# Patient Record
Sex: Female | Born: 1985 | Hispanic: No | Marital: Married | Smoking: Never smoker
Health system: Southern US, Community
[De-identification: ages and names within clinical notes are randomized; demographics above are authoritative.]

## PROBLEM LIST (undated history)

## (undated) DIAGNOSIS — O139 Gestational [pregnancy-induced] hypertension without significant proteinuria, unspecified trimester: Secondary | ICD-10-CM

## (undated) HISTORY — DX: Gestational (pregnancy-induced) hypertension without significant proteinuria, unspecified trimester: O13.9

---

## 2017-09-13 DIAGNOSIS — D4861 Neoplasm of uncertain behavior of right breast: Secondary | ICD-10-CM | POA: Insufficient documentation

## 2017-11-28 DIAGNOSIS — D4860 Neoplasm of uncertain behavior of unspecified breast: Secondary | ICD-10-CM

## 2017-11-28 HISTORY — DX: Neoplasm of uncertain behavior of unspecified breast: D48.60

## 2018-09-01 HISTORY — PX: MASTECTOMY, PARTIAL: SHX709

## 2019-05-23 ENCOUNTER — Encounter: Payer: Self-pay | Admitting: Advanced Practice Midwife

## 2019-05-23 ENCOUNTER — Ambulatory Visit (INDEPENDENT_AMBULATORY_CARE_PROVIDER_SITE_OTHER): Payer: PRIVATE HEALTH INSURANCE | Admitting: Advanced Practice Midwife

## 2019-05-23 ENCOUNTER — Other Ambulatory Visit: Payer: Self-pay

## 2019-05-23 DIAGNOSIS — Z3493 Encounter for supervision of normal pregnancy, unspecified, third trimester: Secondary | ICD-10-CM | POA: Insufficient documentation

## 2019-05-23 DIAGNOSIS — Z3A11 11 weeks gestation of pregnancy: Secondary | ICD-10-CM

## 2019-05-23 DIAGNOSIS — Z3480 Encounter for supervision of other normal pregnancy, unspecified trimester: Secondary | ICD-10-CM | POA: Insufficient documentation

## 2019-05-23 DIAGNOSIS — Z3481 Encounter for supervision of other normal pregnancy, first trimester: Secondary | ICD-10-CM

## 2019-05-23 NOTE — Progress Notes (Signed)
NOB C/o some nausea, and breast tenderness  4 + home test

## 2019-05-24 NOTE — Progress Notes (Signed)
05/23/2019   Virtual Visit via Telephone Note  I connected with Guy Sandifer on 05/23/19 at  8:50 AM EDT by telephone and verified that I am speaking with the correct person using two identifiers.   I discussed the limitations, risks, security and privacy concerns of performing an evaluation and management service by telephone and the availability of in person appointments. I also discussed with the patient that there may be a patient responsible charge related to this service. The patient expressed understanding and agreed to proceed.  The patient was located at home I spoke with the patient from my  Office phone The names of people involved in this encounter were: Lucyle Montemayorand myself Rod Can, CNM.   Chief Complaint: Missed period  Transfer of Care Patient: no  History of Present Illness: Ms. Losano is a 33 y.o. G2P1001 [redacted]w[redacted]d based on Patient's last menstrual period was 03/04/2019 (within days). with an Estimated Date of Delivery: 12/09/19, with the above CC.   Her periods were: regular periods every 28 days She was using no method when she conceived. Planned pregnancy  She has Positive signs or symptoms of nausea/vomiting of pregnancy. She has Negative signs or symptoms of miscarriage or preterm labor She was not taking different medications around the time she conceived/early pregnancy. Since her LMP, she has not used alcohol Since her LMP, she has not used tobacco products Since her LMP, she has not used illegal drugs.   Current or past history of domestic violence. no  Infection History:  1. Since her LMP, she has not had a viral illness.  2. She admits to close contact with children on a regular basis   3. She has a history of chicken pox.  4. Patient or partner has history of genital herpes  no 5. History of STI (GC, CT, HPV, syphilis, HIV)  no  6.  She does not live with someone with TB or TB exposed. 7. History of recent travel :  no 8. She  identifies Negative Zika risk factors for her and her partner 40. There are not cats in the home in the home.  She understands that while pregnant she should not change cat litter.   Genetic Screening Questions: (Includes patient, baby's father, or anyone in either family)   1. Patient's age >/= 28 at [facility]  no 2. Thalassemia ([place], [place], [place], or Asian background): MCV<80  no 3. Neural tube defect (meningomyelocele, spina bifida, anencephaly)  no 4. Congenital heart defect  no  5. Down syndrome  no 6. Tay-Sachs (Jewish, [place])  no 7. Canavan's Disease  no 8. Sickle cell disease or trait (African)  no  9. Hemophilia or other blood disorders  no  10. Muscular dystrophy  no  11. Cystic fibrosis  no  12. Huntington's Chorea  no  13. Mental retardation/autism  no 14. Other inherited genetic or chromosomal disorder  no 15. Maternal metabolic disorder (DM, PKU, etc)  no 16. Patient or FOB with a child with a birth defect not listed above no  16a. Patient or FOB with a birth defect themselves no 17. Recurrent pregnancy loss, or stillbirth  no  18. Any medications since LMP other than prenatal vitamins (include vitamins, supplements, OTC meds, drugs, alcohol)  no 19. Any other genetic/environmental exposure to discuss: by her home scale she weighs 158 today and thinks she was about 152 pounds prior to the pregnancy. According to her chart, she weighed 135 pounds at a clinic visit 1 year ago.  She admits to having gained 20 pounds in the past year.   ROS:  Review of Systems  Constitutional: Negative.   HENT: Negative.   Eyes: Negative.   Respiratory: Negative.   Cardiovascular: Negative.   Gastrointestinal: Negative.   Genitourinary: Negative.   Musculoskeletal: Negative.   Skin: Negative.   Neurological: Negative.   Endo/Heme/Allergies: Negative.   Psychiatric/Behavioral:       Anxiety    OBGYN History: As per HPI. OB History  Gravida Para Term Preterm AB  Living  2 1 1     1   SAB TAB Ectopic Multiple Live Births          1    # Outcome Date GA Lbr Len/2nd Weight Sex Delivery Anes PTL Lv  2 Current           1 Term 07/29/11 [redacted]w[redacted]d  7 lb 1 oz (3.204 kg) F Vag-Spont EPI  LIV    Any issues with any prior pregnancies: G1 SVD 2012 7#, BP elevated at end of pregnancy  Any prior children are healthy, doing well, without any problems or issues: yes Last pap smear 04/03/18 at [facility] ASCUS/HRHPV 2016, LGSIL 2017, LSIL/CIN1 with positive HPV (HR (non 16/18) not detected)  2019, notes say colposcopy for treatment but no record of colposcopy done History of STIs: No   Past Medical History: Past Medical History:  Diagnosis Date  . Gestational hypertension   . 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 MASTECTOMY, PARTIAL Right 09/01/2018    Family History:  Family History  Problem Relation Age of Onset  . Hypertension Mother   . Hypertension Father    She denies any female cancers, bleeding or blood clotting disorders.   Social History:  Social History   Socioeconomic History  . Marital status: Significant Other    Spouse name: Not on file  . Number of children: Not on file  . Years of education: Not on file  . Highest education level: Not on file  Occupational History  . Not on file  Social Needs  . Financial resource strain: Not on file  . Food insecurity    Worry: Not on file    Inability: Not on file  . Transportation needs    Medical: Not on file    Non-medical: Not on file  Tobacco Use  . Smoking status: Never Smoker  . Smokeless tobacco: Never Used  Substance and Sexual Activity  . Alcohol use: Not Currently    Frequency: Never  . Drug use: Never  . Sexual activity: Yes    Birth control/protection: None  Lifestyle  . Physical activity    Days per week: Not on file    Minutes per session: Not on file  .  Stress: Not on file  Relationships  . Social Herbalist on phone: Not on file    Gets together: Not on file    Attends religious service: Not on file    Active member of club or organization: Not on file    Attends meetings of clubs or organizations: Not on file    Relationship status: Not on file  . Intimate partner violence    Fear of current or ex partner: Not on file    Emotionally abused: Not on file    Physically abused: Not on file    Forced sexual activity: Not on file  Other Topics Concern  . Not on file  Social History Narrative  . Not on file    Allergy: No Known Allergies  Current Outpatient Medications: No current outpatient medications on file.   Physical Exam: Physical Exam could not be performed. Because of the COVID-19 outbreak this visit was performed over the phone and not in person.    Assessment: Ms. Boyack is a 33 y.o. G2P1001 [redacted]w[redacted]d based on Patient's last menstrual period was 03/04/2019 (within days). with an Estimated Date of Delivery: 12/09/19,  for prenatal care.  Plan:  1) Avoid alcoholic beverages. 2) Patient encouraged not to smoke.  3) Discontinue the use of all non-medicinal drugs and chemicals.  4) Take prenatal vitamins daily.  5) Seatbelt use advised 6) Nutrition, food safety (fish, cheese advisories, and high nitrite foods) and exercise discussed. 7) Hospital and practice style delivering at [facility] discussed  8) Patient is asked about travel to areas at risk for the [place] virus, and counseled to avoid travel and exposure to mosquitoes or sexual partners who may have themselves been exposed to the virus. Testing is discussed, and will be ordered as appropriate.  9) [facility]birth classes at [facility] advised 10) Genetic Screening, such as with 1st Trimester Screening, cell free fetal DNA, AFP testing, and Ultrasound, as well as with amniocentesis and CVS as appropriate, is discussed with patient. She plans to have cell free DNA genetic  testing this pregnancy. 11) In person visit in 1 week for dating and all prenatal labs including PAP smear, and MaterniT 21 if dates agree   Problem list reviewed and updated.  I discussed the assessment and treatment plan with the patient. The patient was provided an opportunity to ask questions and all were answered. The patient agreed with the plan and demonstrated an understanding of the instructions.   The patient was advised to call back or seek an in-person evaluation if the symptoms worsen or if the condition fails to improve as anticipated.  I provided 25 minutes of non-face-to-face time during this encounter.   Rod Can, [facility] 05/24/2019, 1:07 PM

## 2019-06-03 ENCOUNTER — Other Ambulatory Visit: Payer: Self-pay

## 2019-06-03 ENCOUNTER — Ambulatory Visit (INDEPENDENT_AMBULATORY_CARE_PROVIDER_SITE_OTHER): Payer: PRIVATE HEALTH INSURANCE

## 2019-06-03 ENCOUNTER — Other Ambulatory Visit (HOSPITAL_COMMUNITY)
Admission: RE | Admit: 2019-06-03 | Discharge: 2019-06-03 | Disposition: A | Discharge status: Home / Self Care | Payer: PRIVATE HEALTH INSURANCE | Source: Ambulatory Visit | Attending: Obstetrics and Gynecology | Admitting: Obstetrics and Gynecology

## 2019-06-03 ENCOUNTER — Ambulatory Visit (INDEPENDENT_AMBULATORY_CARE_PROVIDER_SITE_OTHER): Payer: PRIVATE HEALTH INSURANCE | Admitting: Obstetrics and Gynecology

## 2019-06-03 ENCOUNTER — Encounter: Payer: Self-pay | Admitting: Obstetrics and Gynecology

## 2019-06-03 ENCOUNTER — Other Ambulatory Visit: Payer: Self-pay | Admitting: Advanced Practice Midwife

## 2019-06-03 VITALS — BP 90/60 | Wt 157.6 lb

## 2019-06-03 DIAGNOSIS — Z113 Encounter for screening for infections with a predominantly sexual mode of transmission: Secondary | ICD-10-CM | POA: Diagnosis present

## 2019-06-03 DIAGNOSIS — Z3480 Encounter for supervision of other normal pregnancy, unspecified trimester: Secondary | ICD-10-CM | POA: Insufficient documentation

## 2019-06-03 DIAGNOSIS — Z3A13 13 weeks gestation of pregnancy: Secondary | ICD-10-CM

## 2019-06-03 DIAGNOSIS — Z3A01 Less than 8 weeks gestation of pregnancy: Secondary | ICD-10-CM | POA: Diagnosis not present

## 2019-06-03 DIAGNOSIS — Z3481 Encounter for supervision of other normal pregnancy, first trimester: Secondary | ICD-10-CM

## 2019-06-03 DIAGNOSIS — Z124 Encounter for screening for malignant neoplasm of cervix: Secondary | ICD-10-CM | POA: Diagnosis present

## 2019-06-03 DIAGNOSIS — Z1379 Encounter for other screening for genetic and chromosomal anomalies: Secondary | ICD-10-CM

## 2019-06-03 DIAGNOSIS — O3481 Maternal care for other abnormalities of pelvic organs, first trimester: Secondary | ICD-10-CM

## 2019-06-03 DIAGNOSIS — N8311 Corpus luteum cyst of right ovary: Secondary | ICD-10-CM | POA: Diagnosis not present

## 2019-06-03 LAB — POCT URINALYSIS DIPSTICK OB
Glucose, UA: NEGATIVE
POC,PROTEIN,UA: NEGATIVE

## 2019-06-03 NOTE — Progress Notes (Signed)
ROB/Dating/NOB labs- no concerns

## 2019-06-03 NOTE — Progress Notes (Signed)
    Routine Prenatal Care Visit  Subjective  Heather Horn is a 33 y.o. G2P1001 at [redacted]w[redacted]d being seen today for ongoing prenatal care.  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  ----------------------------------------------------------------------------------- Patient reports no complaints.   Contractions: Not present. Vag. Bleeding: None.  Movement: Absent. Denies leaking of fluid.  ----------------------------------------------------------------------------------- The following portions of the patient's history were reviewed and updated as appropriate: allergies, current medications, past family history, past medical history, past social history, past surgical history and problem list. Problem list updated.   Objective  Blood pressure 90/60, weight 157 lb 9.6 oz (71.5 kg), last menstrual period 03/04/2019. Pregravid weight 152 lb (68.9 kg) Total Weight Gain 5 lb 9.6 oz (2.54 kg) Urinalysis:      Fetal Status: Fetal Heart Rate (bpm): 155   Movement: Absent     General:  Alert, oriented and cooperative. Patient is in no acute distress.  Skin: Skin is warm and dry. No rash noted.   Cardiovascular: Normal heart rate noted  Respiratory: Normal respiratory effort, no problems with respiration noted  Abdomen: Soft, gravid, appropriate for gestational age. Pain/Pressure: Absent     Pelvic:  Cervical exam performed        Extremities: Normal range of motion.     Mental Status: Normal mood and affect. Normal behavior. Normal judgment and thought content.     Assessment   33 y.o. G2P1001 at [redacted]w[redacted]d by  01/15/2020, by Ultrasound presenting for routine prenatal visit  Plan   Pregnancy #2 Problems (from 03/04/19 to present)    Problem Noted Resolved   Supervision of other normal pregnancy, antepartum 05/23/2019 by  Rod Can, CNM No   Overview Signed 05/23/2019  9:38 AM by Rod Can, [place] Prenatal Labs  Dating  Blood type:     Genetic Screen 1 Screen:    AFP:     Quad:     NIPS: Antibody:   Anatomic [place]  Rubella:   Varicella: @VZVIGG @  GTT Early:               Third trimester:  RPR:     Rhogam  HBsAg:     TDaP vaccine                       Flu Shot: HIV:     Baby Food                                GBS:   Contraception  Pap: hx LSIL 2019 neg HR HPV  CBB     CS/VBAC NA Partial R mastectomy-borderline phyllodes tumor 2018  Support Person Lennette Bihari              Gestational age appropriate obstetric precautions including but not limited to vaginal bleeding, contractions, leaking of fluid and fetal movement were reviewed in detail with the patient.    Pap collected NOB labs Desire Maternit21 at next visit  Return in about 4 weeks (around 07/01/2019) for ROB.  Homero Fellers MD [facility], [facility] 06/03/2019, 9:39 AM

## 2019-06-03 NOTE — Patient Instructions (Signed)
Hello,  Given the current COVID-19 pandemic, our practice is making changes in how we are providing care to our patients. We are limiting in-person visits for the safety of all of our patients.   As a practice, we have met to discuss the best way to minimize visits, but still provide excellent care to our expecting mothers.  We have decided on the following visit structure for low-risk pregnancies.  Initial Pregnancy visit will be conducted as a telephone or web visit.  Between 10-14 weeks  there will be one in-person visit for an ultrasound, lab work, and genetic screening. 20 weeks in-person visit with an anatomy ultrasound  28 weeks in-person office visit for a 1-hour glucose test and a TDAP vaccination 32 weeks in-person office visit 34 weeks telephone visit 36 weeks in-person office visit for GBS, chlamydia, and gonorrhea testing 38 weeks in-person office visit 40 weeks in-person office visit  Understandably, some patients will require more visits than what is outlined above. Additional visits will be determined on a case-by-case basis.   We will, as always, be available for emergencies or to address concerns that might arise between in-person visits. We ask that you allow [place] the opportunity to address any concerns over the phone or through a virtual visit first. We will be available to return your phone calls throughout the day.   If you are able to purchase a scale, a blood pressure machine, and a home fetal doppler visits could be limited further. This will help decrease your exposure risks, but these purchases are not a necessity.   Things seem to change daily and there is the possibility that this structure could change, please be patient as we adapt to a new way of caring for patients.   Thank you for trusting [place] with your prenatal care. Our practice values you and looks forward to providing you with excellent care.   Sincerely,   [facility], [place]     COVID-19 and Your Pregnancy FAQ  How can I prevent infection with COVID-19 during my pregnancy? Social distancing is key. Please limit any interactions in public. Try and work from home if possible. Frequently wash your hands after touching possibly contaminated surfaces. Avoid touching your face.  Minimize trips to the store. Consider online ordering when possible.   Should I wear a mask? YES. It is recommended by the CDC that all people wear a cloth mask or facial covering in public. You should wear a mask to your visits in the office. This will help reduce transmission as well as your risk or acquiring COVID-19. New studies are showing that even asymptomatic individuals can spread the virus from talking.   Where can I get a mask? [place] and the city of [place] are partnering to provide masks to community members. You can pick up a mask from several locations. This website also has instructions about how to make a mask by sewing or without sewing by using a t-shirt or bandana.  https://www.[place].gov/i-want-to/learn-about/covid-19-information-and-updates/covid-19-face-mask-project  Studies have shown that if you were a tube or nylon stocking from pantyhose over a cloth mask it makes the cloth mask almost as effective as a N95 mask.  https://www.davis-walter.com/  What are the symptoms of COVID-19? Fever (greater than 100.4 F), dry cough, shortness of breath.  Am I more at risk for COVID-19 since I am pregnant? There is not currently data showing that pregnant women are more adversely impacted by COVID-19 than the general population. However, we  know that pregnant women tend to have worse respiratory complications from similar diseases such as the flu and SARS and for this reason should be considered an at-risk population.  What do I do if I am experiencing  the symptoms of COVID-19? Testing is being limited because of test availability. If you are experiencing symptoms you should quarantine yourself, and the members of your family, for at least 2 weeks at home.   Please visit this website for more information: RunningShows.co.za.html  When should I go to the Emergency Room? Please go to the emergency room if you are experiencing ANY of these symptoms*:  1.    Difficulty breathing or shortness of breath 2.    Persistent pain or pressure in the chest 3.    Confusion or difficulty being aroused (or awakened) 4.    Bluish lips or face  *This list is not all inclusive. Please consult our office for any other symptoms that are severe or concerning.  What do I do if I am having difficulty breathing? You should go to the Emergency Room for evaluation. At this time they have a tent set up for evaluating patients with COVID-19 symptoms.   How will my prenatal care be different because of the COVID-19 pandemic? It has been recommended to reduce the frequency of face-to-face visits and use resources such as telephone and virtual visits when possible. Using a scale, blood pressure machine and fetal doppler at home can further help reduce face-to-face visits. You will be provided with additional information on this topic.  We ask that you come to your visits alone to minimize potential exposures to  COVID-19.  How can I receive childbirth education? At this time in-person classes have been cancelled. You can register for online childbirth education, breastfeeding, and newborn care classes.  Please visit:  [email or web address] for more information  How will my hospital birth experience be different? The hospital is currently limiting visitors. This means that while you are in labor you can only have one person at the hospital with you. Additional family members will not be allowed to wait in the  building or outside your room. Your one support person can be the father of the baby, a relative, a doula, or a friend. Once one support person is designated that person will wear a band. This band cannot be shared with multiple people.  Nitrous Gas is not being offered for pain relief since the tubing and filter for the machine can not be sanitized in a way to guarantee prevention of transmission of COVID-19.  Nasal cannula use of oxygen for fetal indications has also been discontinued.  Currently a clear plastic sheet is being hung between mom and the delivering provider during pushing and delivery to help prevent transmission of COVID-19.      How long will I stay in the hospital for after giving birth? It is also recommended that discharge home be expedited during the COVID-19 outbreak. This means staying for 1 day after a vaginal delivery and 2 days after a cesarean section. Patients who need to stay longer for medical reasons are allowed to do so, but the goal will be for expedited discharge home.   What if I have COVID-19 and I am in labor? We ask that you wear a mask while on labor and delivery. We will try and accommodate you being placed in a room that is capable of filtering the air. Please call ahead if you are in labor and on your way  to the hospital. The phone number for labor and delivery at [facility] is [phone].  If I have COVID-19 when my baby is born how can I prevent my baby from contracting COVID-19? This is an issue that will have to be discussed on a case-by-case basis. Current recommendations suggest providing separate isolation rooms for both the mother and new infant as well as limiting visitors. However, there are practical challenges to this recommendation. The situation will assuredly change and decisions will be influenced by the desires of the mother and availability of space.  Some suggestions are the use of a curtain or physical barrier  between mom and infant, hand hygiene, mom wearing a mask, or 6 feet of spacing between a mom and infant.   Can I breastfeed during the COVID-19 pandemic?   Yes, breastfeeding is encouraged.  Can I breastfeed if I have COVID-19? Yes. Covid-19 has not been found in breast milk. This means you cannot give COVID-19 to your child through breast milk. Breast feeding will also help pass antibodies to fight infection to your baby.   What precautions should I take when breastfeeding if I have COVID-19? If a mother and newborn do room-in and the mother wishes to feed at the breast, she should put on a facemask and practice hand hygiene before each feeding.  What precautions should I take when pumping if I have COVID-19? Prior to expressing breast milk, mothers should practice hand hygiene. After each pumping session, all parts that come into contact with breast milk should be thoroughly washed and the entire pump should be appropriately disinfected per the manufacturer's instructions. This expressed breast milk should be fed to the newborn by a healthy caregiver.  What if I am pregnant and work in healthcare? Based on limited data regarding COVID-19 and pregnancy, ACOG currently does not propose creating additional restrictions on pregnant health care personnel because of COVID-19 alone. Pregnant women do not appear to be at higher risk of severe disease related to COVID-19. Pregnant health care personnel should follow CDC risk assessment and infection control guidelines for health care personnel exposed to patients with suspected or confirmed COVID-19. Adherence to recommended infection prevention and control practices is an important part of protecting all health care personnel in health care settings.    Information on COVID-19 in pregnancy is very limited; however, facilities may want to consider limiting exposure of pregnant health care personnel to patients with confirmed or suspected COVID-19  infection, especially during higher-risk procedures (eg, aerosol-generating procedures), if feasible, based on staffing availability.      Pregnancy and COVID-19 Coronavirus disease, also called COVID-19, is an infection of the lungs and airways (respiratory tract). It is unclear at this time if pregnancy makes it more likely for you to get COVID-19, or what effects the infection may have on your unborn baby. However, pregnancy causes changes to your heart, lungs, and the body's disease-fighting system (immune system). Some of these changes make it more likely for you to get sick and have more serious illness. Therefore, it is important for you to take precautions in order to protect yourself and your unborn baby. Work with your health care team to develop a plan to protect yourself from all infections, including COVID-19. This is one way for you to stay healthy during your pregnancy and to keep your baby healthy as well. How does this affect me? If you get COVID-19, there is a risk that you may:  Get a respiratory illness  that can lead to pneumonia.  Give birth to your baby before 37 weeks of pregnancy (premature birth). If you have or may have COVID-19, your health care provider may recommend special precautions around your pregnancy. This may affect how you:  Receive care before delivery (prenatal care). How you visit your health care provider may change. Tests and scans may need to be performed differently.  Receive care during labor and delivery. This may affect your birth plan, including who may be with you during labor and delivery.  Receive care after you deliver your baby (postpartum care). You may stay longer in the hospital, and in a special room. Your baby may also need to stay away from you.  Feed your baby after he or she is born. Pregnancy can be an especially stressful time because of the changes in your body and the preparation involved in becoming a parent. In addition, you  may be feeling especially fearful, anxious, or stressed because of COVID-19 and how it is affecting you. How does this affect my baby? It is not known whether a mother will transmit the virus to her unborn baby. There is a risk that if you get COVID-19:  The virus that causes COVID-19 can pass to your baby.  You may have premature birth. Your baby may require more medical care if this happens. What can I do to lower my risk?  There is no vaccine to help prevent COVID-19. However, there are actions that you can take to protect yourself and others from this virus. Cleaning and personal hygiene  Wash your hands often with soap and water for at least 20 seconds. If soap and water are not available, use alcohol-based hand sanitizer.  Avoid touching your mouth, face, eyes, or nose.  Clean and disinfect objects and surfaces that are frequently touched every day. This may include: ? Counters and tables. ? Doorknobs and light switches. ? Sinks and faucets. ? Electronics such as phones, remote controls, keyboards, computers, and tablets. Stay away from others  Stay away from people who are sick, if possible.  Avoid social gatherings and travel.  Stay home as much as possible. Follow these instructions: Breastfeeding It is not known if the virus that causes COVID-19 can pass through breast milk to your baby. You should make a plan for feeding your infant with your family and your health care team. If you have or may have COVID-19, your health care provider may recommend that you take precautions while breastfeeding, such as:  Washing your hands before feeding your baby.  Wearing a mask while feeding your baby.  Pumping or expressing breast milk to feed to your baby. If possible, ask someone in your household who is not sick to feed your baby the expressed breast milk. ? Wash your hands before touching pump parts. ? Wash and disinfect all pump parts after expressing milk. Follow the  manufacturer's instructions to clean and disinfect all pump parts. General instructions  If you think you have a COVID-19 infection, contact your health care provider right away. Tell your health care provider that you think you may have a COVID-19 infection.  Follow your health care provider's instructions on taking medicines. Some medicines may be unsafe to take during pregnancy.  Cover your mouth and nose by wearing a mask or other cloth covering over your face when you go out in public.  Find ways to manage stress. These may include: ? Using relaxation techniques like meditation and deep breathing. ? Getting regular  exercise. Most women can continue their usual exercise routine during pregnancy. Ask your health care provider what activities are safe for you. ? Seeking support from family, friends, or spiritual resources. If you cannot be together in person, you can still connect by phone calls, texts, video calls, or online messaging. ? Spending time doing relaxing activities that you enjoy, like listening to music or reading a good book.  Ask for help if you have counseling or nutritional needs during pregnancy. Your health care provider can offer advice or refer you to resources or specialists who can help you with various needs.  Keep all follow-up visits as told by your health care provider. This is important. Where to find more information Centers for Disease Control and Prevention (CDC): http://gutierrez-robinson.com/ World Health Organization [facility]): https://www.morales.com/ [employer] of [employer]st[employer]ians and Gynecologists (ACOG): StickerEmporium.tn Questions to ask your health care team  What should I do if I have COVID-19 symptoms?  How will COVID-19 affect my prenatal care visits, tests and scans, labor and delivery, and postpartum care?   Should I plan to breastfeed my baby?  Where can I find mental health resources?  Where can I find support if I have financial concerns? Contact a health care provider if:  You have signs and symptoms of infection, including a fever or cough. Tell your health care team that you think you may have a COVID-19 infection.  You have strong emotions, such as sadness or anxiety.  You feel unsafe in your home and need help finding a safe place to live.  You have bloody or watery vaginal discharge or vaginal bleeding. Get help right away if:  You have signs or symptoms of labor before 37 weeks of pregnancy. These include: ? Contractions that are 5 minutes or less apart, or that increase in frequency, intensity, or length. ? Sudden, sharp pain in the abdomen or in the lower back. ? A gush or trickle of fluid from your vagina.  You have difficulty breathing.  You have chest pain. These symptoms may represent a serious problem that is an emergency. Do not wait to see if the symptoms will go away. Get medical help right away. Call your local emergency services (911 in the [place] Do not drive yourself to the hospital. Summary  Coronavirus disease, also called COVID-19, is an infection of the lungs and airways (respiratory tract). It is unclear at this time if pregnancy makes you more susceptible to COVID-19 and what effects it may have on unborn babies.  It is important to take precautions to protect yourself and your developing baby. This includes washing your hands often, avoiding touching your mouth, face, eyes, or nose, avoiding social gatherings and travel, and staying away from people who are sick.  If you think you have a COVID-19 infection, contact your health care provider right away. Tell your health care provider that you think you may have a COVID-19 infection.  If you have or may have COVID-19, your health care provider may recommend special precautions during your pregnancy, labor and  delivery, and after your baby is born. This information is not intended to replace advice given to you by your health care provider. Make sure you discuss any questions you have with your health care provider. Document Released: 03/12/2019 Document Revised: 03/12/2019 Document Reviewed: 03/12/2019 Elsevier Patient Education  [place].

## 2019-06-04 LAB — MONITOR DRUG PROFILE 10(MW)
Amphetamine Scrn, Ur: NEGATIVE ng/mL
BARBITURATE SCREEN URINE: NEGATIVE ng/mL
BENZODIAZEPINE SCREEN, URINE: NEGATIVE ng/mL
CANNABINOIDS UR QL SCN: NEGATIVE ng/mL
Cocaine (Metab) Scrn, Ur: NEGATIVE ng/mL
Creatinine(Crt), U: 150.4 mg/dL (ref 20.0–300.0)
Methadone Screen, Urine: NEGATIVE ng/mL
OXYCODONE+OXYMORPHONE UR QL SCN: NEGATIVE ng/mL
Opiate Scrn, Ur: NEGATIVE ng/mL
Ph of Urine: 6.9 (ref 4.5–8.9)
Phencyclidine Qn, Ur: NEGATIVE ng/mL
Propoxyphene Scrn, Ur: NEGATIVE ng/mL

## 2019-06-04 LAB — RPR+RH+ABO+RUB AB+AB SCR+CB...
Antibody Screen: NEGATIVE
HIV Screen 4th Generation wRfx: NONREACTIVE
Hematocrit: 41.3 % (ref 34.0–46.6)
Hemoglobin: 14 g/dL (ref 11.1–15.9)
Hepatitis B Surface Ag: NEGATIVE
MCH: 29 pg (ref 26.6–33.0)
MCHC: 33.9 g/dL (ref 31.5–35.7)
MCV: 86 fL (ref 79–97)
Platelets: 352 10*3/uL (ref 150–450)
RBC: 4.83 x10E6/uL (ref 3.77–5.28)
RDW: 12.4 % (ref 11.7–15.4)
RPR Ser Ql: NONREACTIVE
Rh Factor: POSITIVE
Rubella Antibodies, IGG: 9.67 index (ref >0.99)
Varicella zoster IgG: 290 index (ref >165)
WBC: 11.8 10*3/uL — ABNORMAL HIGH (ref 3.4–10.8)

## 2019-06-05 LAB — CYTOLOGY - PAP
Chlamydia: NEGATIVE
Diagnosis: NEGATIVE
HPV: NOT DETECTED
Neisseria Gonorrhea: NEGATIVE

## 2019-06-05 LAB — URINE CULTURE

## 2019-07-01 ENCOUNTER — Encounter: Payer: Self-pay | Admitting: Advanced Practice Midwife

## 2019-07-01 ENCOUNTER — Ambulatory Visit (INDEPENDENT_AMBULATORY_CARE_PROVIDER_SITE_OTHER): Payer: PRIVATE HEALTH INSURANCE | Admitting: Advanced Practice Midwife

## 2019-07-01 ENCOUNTER — Other Ambulatory Visit: Payer: Self-pay

## 2019-07-01 VITALS — BP 104/64 | Wt 159.0 lb

## 2019-07-01 DIAGNOSIS — Z3480 Encounter for supervision of other normal pregnancy, unspecified trimester: Secondary | ICD-10-CM

## 2019-07-01 DIAGNOSIS — Z3481 Encounter for supervision of other normal pregnancy, first trimester: Secondary | ICD-10-CM

## 2019-07-01 DIAGNOSIS — Z3A11 11 weeks gestation of pregnancy: Secondary | ICD-10-CM

## 2019-07-01 NOTE — Progress Notes (Signed)
  Routine Prenatal Care Visit  Subjective  Heather Horn is a 33 y.o. G2P1001 at [redacted]w[redacted]d being seen today for ongoing prenatal care.  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  ----------------------------------------------------------------------------------- Patient reports no complaints.   Contractions: Not present. Vag. Bleeding: None.   . Denies leaking of fluid.  ----------------------------------------------------------------------------------- The following portions of the patient's history were reviewed and updated as appropriate: allergies, current medications, past family history, past medical history, past social history, past surgical history and problem list. Problem list updated.   Objective  Blood pressure 104/64, weight 159 lb (72.1 kg), last menstrual period 03/04/2019. Pregravid weight 152 lb (68.9 kg) Total Weight Gain 7 lb (3.175 kg) Urinalysis: Urine Protein    Urine Glucose    Fetal Status: Fetal Heart Rate (bpm): 169         General:  Alert, oriented and cooperative. Patient is in no acute distress.  Skin: Skin is warm and dry. No rash noted.   Cardiovascular: Normal heart rate noted  Respiratory: Normal respiratory effort, no problems with respiration noted  Abdomen: Soft, gravid, appropriate for gestational age. Pain/Pressure: Absent     Pelvic:  Cervical exam deferred        Extremities: Normal range of motion.     Mental Status: Normal mood and affect. Normal behavior. Normal judgment and thought content.   Assessment   33 y.o. G2P1001 at [redacted]w[redacted]d by  01/15/2020, by Ultrasound presenting for routine prenatal visit  Plan   Pregnancy #2 Problems (from 03/04/19 to present)    Problem Noted Resolved   Supervision of other normal pregnancy, antepartum 05/23/2019 by Rod Can, CNM  No   Overview Signed 05/23/2019  9:38 AM by Rod Can, [place] Prenatal Labs  Dating  Blood type:     Genetic Screen 1 Screen:    AFP:     Quad:     NIPS: Antibody:   Anatomic [place]  Rubella:   Varicella: @VZVIGG @  GTT Early:               Third trimester:  RPR:     Rhogam  HBsAg:     TDaP vaccine                       Flu Shot: HIV:     Baby Food                                GBS:   Contraception  Pap: hx LSIL 2019 neg HR HPV  CBB     CS/VBAC NA Partial R mastectomy-borderline phyllodes tumor 2018  Support Person Lennette Bihari              Preterm labor symptoms and general obstetric precautions including but not limited to vaginal bleeding, contractions, leaking of fluid and fetal movement were reviewed in detail with the patient. Please refer to After Visit Summary for other counseling recommendations.   Return in about 4 weeks (around 07/29/2019) for rob.  Rod Can, CNM 07/01/2019 2:25 PM

## 2019-07-01 NOTE — Progress Notes (Signed)
No vb. No lof. Pt c/o some dizziness

## 2019-07-01 NOTE — Patient Instructions (Signed)
Exercise During Pregnancy Exercise is an important part of being healthy for people of all ages. Exercise improves the function of your heart and lungs and helps you maintain strength, flexibility, and a healthy body weight. Exercise also boosts energy levels and elevates mood. Most women should exercise regularly during pregnancy. In rare cases, women with certain medical conditions or complications may be asked to limit or avoid exercise during pregnancy. How does this affect me? Along with maintaining general strength and flexibility, exercising during pregnancy can help:  Keep strength in muscles that are used during labor and childbirth.  Decrease low back pain.  Reduce symptoms of depression.  Control weight gain during pregnancy.  Reduce the risk of needing insulin if you develop diabetes during pregnancy.  Decrease the risk of cesarean delivery.  Speed up your recovery after giving birth. How does this affect my baby? Exercise can help you have a healthy pregnancy. Exercise does not cause premature birth. It will not cause your baby to weigh less at birth. What exercises can I do? Many exercises are safe for you to do during pregnancy. Do a variety of exercises that safely increase your heart and breathing rates and help you build and maintain muscle strength. Do exercises exactly as told by your health care provider. You may do these exercises:  Walking or hiking.  Swimming.  Water aerobics.  Riding a stationary bike.  Strength training.  Modified yoga or Pilates. Tell your instructor that you are pregnant. Avoid overstretching, and avoid lying on your back for long periods of time.  Running or jogging. Only choose this type of exercise if you: ? Ran or jogged regularly before your pregnancy. ? Can run or jog and still talk in complete sentences. What exercises should I avoid? Depending on your level of fitness and whether you exercised regularly before your  pregnancy, you may be told to limit high-intensity exercise. You can tell that you are exercising at a high intensity if you are breathing much harder and faster and cannot hold a conversation while exercising. You must avoid:  Contact sports.  Activities that put you at risk for falling on or being hit in the belly, such as downhill skiing, water skiing, surfing, rock climbing, cycling, gymnastics, and horseback riding.  Scuba diving.  Skydiving.  Yoga or Pilates in a room that is heated to high temperatures.  Jogging or running, unless you ran or jogged regularly before your pregnancy. While jogging or running, you should always be able to talk in full sentences. Do not run or jog so fast that you are unable to have a conversation.  Do not exercise at more than 6,000 feet above sea level (high elevation) if you are not used to exercising at high elevation. How do I exercise in a safe way?   Avoid overheating. Do not exercise in very high temperatures.  Wear loose-fitting, breathable clothes.  Avoid dehydration. Drink enough water before, during, and after exercise to keep your urine pale yellow.  Avoid overstretching. Because of hormone changes during pregnancy, it is easy to overstretch muscles, tendons, and ligaments during pregnancy.  Start slowly and ask your health care provider to recommend the types of exercise that are safe for you.  Do not exercise to lose weight. Follow these instructions at home:  Exercise on most days or all days of the week. Try to exercise for 30 minutes a day, 5 days a week, unless your health care provider tells you not to.  If  you actively exercised before your pregnancy and you are healthy, your health care provider may tell you to continue to do moderate to high-intensity exercise.  If you are just starting to exercise or did not exercise much before your pregnancy, your health care provider may tell you to do low to moderate-intensity  exercise. Questions to ask your health care provider  Is exercise safe for me?  What are signs that I should stop exercising?  Does my health condition mean that I should not exercise during pregnancy?  When should I avoid exercising during pregnancy? Stop exercising and contact a health care provider if: You have any unusual symptoms, such as:  Mild contractions of the uterus or cramps in the abdomen.  Dizziness that does not go away when you rest. Stop exercising and get help right away if: You have any unusual symptoms, such as:  Sudden, severe pain in your low back or your belly.  Mild contractions of the uterus or cramps in the abdomen that do not improve with rest and drinking fluids.  Chest pain.  Bleeding or fluid leaking from your vagina.  Shortness of breath. These symptoms may represent a serious problem that is an emergency. Do not wait to see if the symptoms will go away. Get medical help right away. Call your local emergency services (911 in the [place] Do not drive yourself to the hospital. Summary  Most women should exercise regularly throughout pregnancy. In rare cases, women with certain medical conditions or complications may be asked to limit or avoid exercise during pregnancy.  Do not exercise to lose weight during pregnancy.  Your health care provider will tell you what level of physical activity is right for you.  Stop exercising and contact a health care provider if you have mild contractions of the uterus or cramps in the abdomen. Get help right away if these contractions or cramps do not improve with rest and drinking fluids.  Stop exercising and get help right away if you have sudden, severe pain in your low back or belly, chest pain, shortness of breath, or bleeding or leaking of fluid from your vagina. This information is not intended to replace advice given to you by your health care provider. Make sure you discuss any questions you have with your  health care provider. Document Released: 11/14/2005 Document Revised: 03/07/2019 Document Reviewed: 12/19/2018 Elsevier Patient Education  2020 [place] for Pregnant Women While you are pregnant, your body requires additional nutrition to help support your growing baby. You also have a higher need for some vitamins and minerals, such as folic acid, calcium, iron, and vitamin D. Eating a healthy, well-balanced diet is very important for your health and your baby's health. Your need for extra calories varies for the three 53-monthsegments of your pregnancy (trimesters). For most women, it is recommended to consume:  150 extra calories a day during the first trimester.  300 extra calories a day during the second trimester.  300 extra calories a day during the third trimester. What are tips for following this plan?   Do not try to lose weight or go on a diet during pregnancy.  Limit your overall intake of foods that have "empty calories." These are foods that have little nutritional value, such as sweets, desserts, candies, and sugar-sweetened beverages.  Eat a variety of foods (especially fruits and vegetables) to get a full range of vitamins and minerals.  Take a prenatal vitamin to help meet your additional vitamin  and mineral needs during pregnancy, specifically for folic acid, iron, calcium, and vitamin D.  Remember to stay active. Ask your health care provider what types of exercise and activities are safe for you.  Practice good food safety and cleanliness. Wash your hands before you eat and after you prepare raw meat. Wash all fruits and vegetables well before peeling or eating. Taking these actions can help to prevent food-borne illnesses that can be very dangerous to your baby, such as listeriosis. Ask your health care provider for more information about listeriosis. What does 150 extra calories look like? Healthy options that provide 150 extra calories each day  could be any of the following:  6-8 oz (170-230 g) of plain low-fat yogurt with  cup of berries.  1 apple with 2 teaspoons (11 g) of peanut butter.  Cut-up vegetables with  cup (60 g) of hummus.  8 oz (230 mL) or 1 cup of low-fat chocolate milk.  1 stick of string cheese with 1 medium orange.  1 peanut butter and jelly sandwich that is made with one slice of whole-wheat bread and 1 tsp (5 g) of peanut butter. For 300 extra calories, you could eat two of those healthy options each day. What is a healthy amount of weight to gain? The right amount of weight gain for you is based on your BMI before you became pregnant. If your BMI:  Was less than 18 (underweight), you should gain 28-40 lb (13-18 kg).  Was 18-24.9 (normal), you should gain 25-35 lb (11-16 kg).  Was 25-29.9 (overweight), you should gain 15-25 lb (7-11 kg).  Was 30 or greater (obese), you should gain 11-20 lb (5-9 kg). What if I am having twins or multiples? Generally, if you are carrying twins or multiples:  You may need to eat 300-600 extra calories a day.  The recommended range for total weight gain is 25-54 lb (11-25 kg), depending on your BMI before pregnancy.  Talk with your health care provider to find out about nutritional needs, weight gain, and exercise that is right for you. What foods can I eat?  Grains All grains. Choose whole grains, such as whole-wheat bread, oatmeal, or [place]ino rice. Vegetables All vegetables. Eat a variety of colors and types of vegetables. Remember to wash your vegetables well before peeling or eating. Fruits All fruits. Eat a variety of colors and types of fruit. Remember to wash your fruits well before peeling or eating. Meats and other protein foods Lean meats, including chicken, [place], fish, and lean cuts of beef, veal, or pork. If you eat fish or seafood, choose options that are higher in omega-3 fatty acids and lower in mercury, such as salmon, herring, mussels, trout,  sardines, pollock, shrimp, crab, and lobster. Tofu. Tempeh. Beans. Eggs. Peanut butter and other nut butters. Make sure that all meats, poultry, and eggs are cooked to food-safe temperatures or "well-done." Two or more servings of fish are recommended each week in order to get the most benefits from omega-3 fatty acids that are found in seafood. Choose fish that are lower in mercury. You can find more information online:  [employer]Gaming.ch Dairy Pasteurized milk and milk alternatives (such as almond milk). Pasteurized yogurt and pasteurized cheese. Cottage cheese. Sour cream. Beverages Water. Juices that contain 100% fruit juice or vegetable juice. Caffeine-free teas and decaffeinated coffee. Drinks that contain caffeine are okay to drink, but it is better to avoid caffeine. Keep your total caffeine intake to less than 200 mg each day (which  is 12 oz or 355 mL of coffee, tea, or soda) or the limit as told by your health care provider. Fats and oils Fats and oils are okay to include in moderation. Sweets and desserts Sweets and desserts are okay to include in moderation. Seasoning and other foods All pasteurized condiments. The items listed above may not be a complete list of recommended foods and beverages. Contact your dietitian for more options. The items listed above may not be a complete list of foods and beverages [you/your child] can eat. Contact a dietitian for more information. What foods are not recommended? Vegetables Raw (unpasteurized) vegetable juices. Fruits Unpasteurized fruit juices. Meats and other protein foods Lunch meats, bologna, hot dogs, or other deli meats. (If you must eat those meats, reheat them until they are steaming hot.) Refrigerated pat, meat spreads from a meat counter, smoked seafood that is found in the refrigerated section of a store. Raw or undercooked meats, poultry, and eggs. Raw fish, such as sushi or sashimi. Fish that have high mercury content, such as  tilefish, shark, swordfish, and king mackerel. To learn more about mercury in fish, talk with your health care provider or look for online resources, such as:  [employer]aming.ch Dairy Raw (unpasteurized) milk and any foods that have raw milk in them. Soft cheeses, such as feta, queso blanco, queso fresco, Brie, Camembert cheeses, blue-veined cheeses, and Panela cheese (unless it is made with pasteurized milk, which must be stated on the label). Beverages Alcohol. Sugar-sweetened beverages, such as sodas, teas, or energy drinks. Seasoning and other foods Homemade fermented foods and drinks, such as pickles, sauerkraut, or kombucha drinks. (Store-bought pasteurized versions of these are okay.) Salads that are made in a store or deli, such as ham salad, chicken salad, egg salad, tuna salad, and seafood salad. The items listed above may not be a complete list of foods and beverages to avoid. Contact your dietitian for more information. The items listed above may not be a complete list of foods and beverages [you/your child] should avoid. Contact a dietitian for more information. Where to find more information To calculate the number of calories you need based on your height, weight, and activity level, you can use an online calculator such as:  MobileTransition.ch To calculate how much weight you should gain during pregnancy, you can use an online pregnancy weight gain calculator such as:  StreamingFood.com.cy Summary  While you are pregnant, your body requires additional nutrition to help support your growing baby.  Eat a variety of foods, especially fruits and vegetables to get a full range of vitamins and minerals.  Practice good food safety and cleanliness. Wash your hands before you eat and after you prepare raw meat. Wash all fruits and vegetables well before peeling or eating. Taking these actions can help to prevent food-borne illnesses, such as  listeriosis, that can be very dangerous to your baby.  Do not eat raw meat or fish. Do not eat fish that have high mercury content, such as tilefish, shark, swordfish, and king mackerel. Do not eat unpasteurized (raw) dairy.  Take a prenatal vitamin to help meet your additional vitamin and mineral needs during pregnancy, specifically for folic acid, iron, calcium, and vitamin D. This information is not intended to replace advice given to you by your health care provider. Make sure you discuss any questions you have with your health care provider. Document Released: 08/29/2014 Document Revised: 03/07/2019 Document Reviewed: 08/11/2017 Elsevier Patient Education  2020 [employer]. Prenatal Care Prenatal care  is health care during pregnancy. It helps you and your unborn baby (fetus) stay as healthy as possible. Prenatal care may be provided by a midwife, a family practice health care provider, or a childbirth and pregnancy specialist (obstetrician). How does this affect me? During pregnancy, you will be closely monitored for any new conditions that might develop. To lower your risk of pregnancy complications, you and your health care provider will talk about any underlying conditions you have. How does this affect my baby? Early and consistent prenatal care increases the chance that your baby will be healthy during pregnancy. Prenatal care lowers the risk that your baby will be:  Born early (prematurely).  Smaller than expected at birth (small for gestational age). What can I expect at the first prenatal care visit? Your first prenatal care visit will likely be the longest. You should schedule your first prenatal care visit as soon as you know that you are pregnant. Your first visit is a good time to talk about any questions or concerns you have about pregnancy. At your visit, you and your health care provider will talk about:  Your medical history, including: ? Any past pregnancies. ? Your  family's medical history. ? The baby's father's medical history. ? Any long-term (chronic) health conditions you have and how you manage them. ? Any surgeries or procedures you have had. ? Any current over-the-counter or prescription medicines, herbs, or supplements you are taking.  Other factors that could pose a risk to your baby, including:  Your home setting and your stress levels, including: ? Exposure to abuse or violence. ? Household financial strain. ? Mental health conditions you have.  Your daily health habits, including diet and exercise. Your health care provider will also:  Measure your weight, height, and blood pressure.  Do a physical exam, including a pelvic and breast exam.  Perform blood tests and urine tests to check for: ? Urinary tract infection. ? Sexually transmitted infections (STIs). ? Low iron levels in your blood (anemia). ? Blood type and certain proteins on red blood cells (Rh antibodies). ? Infections and immunity to viruses, such as hepatitis B and rubella. ? HIV (human immunodeficiency virus).  Do an ultrasound to confirm your baby's growth and development and to help predict your estimated due date (EDD). This ultrasound is done with a probe that is inserted into the vagina (transvaginal ultrasound).  Discuss your options for genetic screening.  Give you information about how to keep yourself and your baby healthy, including: ? Nutrition and taking vitamins. ? Physical activity. ? How to manage pregnancy symptoms such as nausea and vomiting (morning sickness). ? Infections and substances that may be harmful to your baby and how to avoid them. ? Food safety. ? Dental care. ? Working. ? Travel. ? Warning signs to watch for and when to call your health care provider. How often will I have prenatal care visits? After your first prenatal care visit, you will have regular visits throughout your pregnancy. The visit schedule is often as  follows:  Up to week 28 of pregnancy: once every 4 weeks.  28-36 weeks: once every 2 weeks.  After 36 weeks: every week until delivery. Some women may have visits more or less often depending on any underlying health conditions and the health of the baby. Keep all follow-up and prenatal care visits as told by your health care provider. This is important. What happens during routine prenatal care visits? Your health care provider will:  Measure your  weight and blood pressure.  Check for fetal heart sounds.  Measure the height of your uterus in your abdomen (fundal height). This may be measured starting around week 20 of pregnancy.  Check the position of your baby inside your uterus.  Ask questions about your diet, sleeping patterns, and whether you can feel the baby move.  Review warning signs to watch for and signs of labor.  Ask about any pregnancy symptoms you are having and how you are dealing with them. Symptoms may include: ? Headaches. ? Nausea and vomiting. ? Vaginal discharge. ? Swelling. ? Fatigue. ? Constipation. ? Any discomfort, including back or pelvic pain. Make a list of questions to ask your health care provider at your routine visits. What tests might I have during prenatal care visits? You may have blood, urine, and imaging tests throughout your pregnancy, such as:  Urine tests to check for glucose, protein, or signs of infection.  Glucose tests to check for a form of diabetes that can develop during pregnancy (gestational diabetes mellitus). This is usually done around week 24 of pregnancy.  An ultrasound to check your baby's growth and development and to check for birth defects. This is usually done around week 20 of pregnancy.  A test to check for group B strep (GBS) infection. This is usually done around week 36 of pregnancy.  Genetic testing. This may include blood or imaging tests, such as an ultrasound. Some genetic tests are done during the first  trimester and some are done during the second trimester. What else can I expect during prenatal care visits? Your health care provider may recommend getting certain vaccines during pregnancy. These may include:  A yearly flu shot (annual influenza vaccine). This is especially important if you will be pregnant during flu season.  Tdap (tetanus, diphtheria, pertussis) vaccine. Getting this vaccine during pregnancy can protect your baby from whooping cough (pertussis) after birth. This vaccine may be recommended between weeks 27 and 36 of pregnancy. Later in your pregnancy, your health care provider may give you information about:  Childbirth and breastfeeding classes.  Choosing a health care provider for your baby.  Umbilical cord banking.  Breastfeeding.  Birth control after your baby is born.  The hospital labor and delivery unit and how to tour it.  Registering at the hospital before you go into labor. Where to find more information  Office on Women's Health: LegalWarrants.gl  American Pregnancy Association: americanpregnancy.org  March of Dimes: marchofdimes.org Summary  Prenatal care helps you and your baby stay as healthy as possible during pregnancy.  Your first prenatal care visit will most likely be the longest.  You will have visits and tests throughout your pregnancy to monitor your health and your baby's health.  Bring a list of questions to your visits to ask your health care provider.  Make sure to keep all follow-up and prenatal care visits with your health care provider. This information is not intended to replace advice given to you by your health care provider. Make sure you discuss any questions you have with your health care provider. Document Released: 11/17/2003 Document Revised: 03/06/2019 Document Reviewed: 11/13/2017 Elsevier Patient Education  2020 [employer].

## 2019-07-05 LAB — MATERNIT 21 PLUS CORE, BLOOD
Fetal Fraction: 8
Result (T21): NEGATIVE
Trisomy 13 (Patau syndrome): NEGATIVE
Trisomy 18 (Edwards syndrome): NEGATIVE
Trisomy 21 (Down syndrome): NEGATIVE

## 2019-07-29 ENCOUNTER — Ambulatory Visit (INDEPENDENT_AMBULATORY_CARE_PROVIDER_SITE_OTHER): Payer: PRIVATE HEALTH INSURANCE | Admitting: Certified Nurse Midwife

## 2019-07-29 ENCOUNTER — Encounter: Payer: Self-pay | Admitting: Certified Nurse Midwife

## 2019-07-29 ENCOUNTER — Other Ambulatory Visit: Payer: Self-pay

## 2019-07-29 VITALS — BP 100/60 | Wt 158.8 lb

## 2019-07-29 DIAGNOSIS — Z3482 Encounter for supervision of other normal pregnancy, second trimester: Secondary | ICD-10-CM

## 2019-07-29 DIAGNOSIS — Z3480 Encounter for supervision of other normal pregnancy, unspecified trimester: Secondary | ICD-10-CM

## 2019-07-29 DIAGNOSIS — Z3A15 15 weeks gestation of pregnancy: Secondary | ICD-10-CM

## 2019-07-29 DIAGNOSIS — Z363 Encounter for antenatal screening for malformations: Secondary | ICD-10-CM

## 2019-07-29 LAB — POCT URINALYSIS DIPSTICK OB: Glucose, UA: NEGATIVE

## 2019-07-29 MED ORDER — SULFAMETHOXAZOLE-TRIMETHOPRIM 800-160 MG PO TABS: 1.0000 | ORAL_TABLET | Freq: Two times a day (BID) | ORAL | 0 refills | Status: DC

## 2019-07-29 NOTE — Progress Notes (Signed)
C/o saw a very tiny blood clot came out a week ago nothing since.rj

## 2019-07-29 NOTE — Progress Notes (Signed)
ROB at 15wk5d: Doing well. Taking prenatal vitamins. Heather Horn was diploid XX Urine culture 7/6 returned positive for >100K E coli.Was not treated.  Denies dysuria, back pain, hematuria, bad odor to urine Declined MSAFP today FHTs WNL Anatomy scan and ROB in 3-4 weeks Bactrim DS BID x 7 days (avoid prolonged sunshine exposure)  Dalia Heading, CNM

## 2019-07-30 ENCOUNTER — Telehealth: Payer: Self-pay

## 2019-07-30 NOTE — Telephone Encounter (Signed)
Patient has question about ABX that was rx'd to her yesterday. [id number]

## 2019-07-30 NOTE — Telephone Encounter (Signed)
Patient was questioning if the Bactrim was safe in pregnancy as she saw a warning about it possibly causing Neural tube defects. I explained that the warnings are required to go on the medication and that most are very rare as a side effect likely if taken in the first trimester. She is second trimester & provider wouldn't prescribe if unsafe. Pt satisfied. Notified will pass along to [place] for review.

## 2019-07-30 NOTE — Telephone Encounter (Signed)
Message sent via [place]. Bactrim is safe in the second trimester.

## 2019-07-31 NOTE — Telephone Encounter (Signed)
Called patient today and reiterated what was in my Mychart message. She was reassured that Bactrim was OK to take at this time in her pregnancy.

## 2019-08-26 ENCOUNTER — Ambulatory Visit (INDEPENDENT_AMBULATORY_CARE_PROVIDER_SITE_OTHER): Payer: PRIVATE HEALTH INSURANCE | Admitting: Obstetrics and Gynecology

## 2019-08-26 ENCOUNTER — Other Ambulatory Visit: Payer: Self-pay

## 2019-08-26 ENCOUNTER — Ambulatory Visit (INDEPENDENT_AMBULATORY_CARE_PROVIDER_SITE_OTHER): Payer: PRIVATE HEALTH INSURANCE

## 2019-08-26 ENCOUNTER — Encounter: Payer: Self-pay | Admitting: Obstetrics and Gynecology

## 2019-08-26 VITALS — BP 104/60 | Wt 165.0 lb

## 2019-08-26 DIAGNOSIS — Z363 Encounter for antenatal screening for malformations: Secondary | ICD-10-CM

## 2019-08-26 DIAGNOSIS — Z3A19 19 weeks gestation of pregnancy: Secondary | ICD-10-CM

## 2019-08-26 DIAGNOSIS — Z3480 Encounter for supervision of other normal pregnancy, unspecified trimester: Secondary | ICD-10-CM

## 2019-08-26 DIAGNOSIS — O2342 Unspecified infection of urinary tract in pregnancy, second trimester: Secondary | ICD-10-CM

## 2019-08-26 NOTE — Progress Notes (Signed)
ROB/Anatomy C/o pain in pelvic bone area with walking, and standing for a while  Denies lof, no vb Some flutters Declines flu shot

## 2019-08-26 NOTE — Progress Notes (Signed)
Routine Prenatal Care Visit  Subjective  Heather Horn is a 33 y.o. G2P1001 at [redacted]w[redacted]d being seen today for ongoing prenatal care.  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  ----------------------------------------------------------------------------------- Patient reports pain at her pubic bone. She feels pelvic pressure from the pregnancy and like she has a bowling ball in her pelvis and pressing on her bladder.  Contractions: Not present. Vag. Bleeding: None.  Movement: Present. Denies leaking of fluid.  ----------------------------------------------------------------------------------- The following portions of the patient's history were reviewed and updated as appropriate: allergies, current medications, past family history, past medical history, past social history, past surgical history and problem list. Problem list updated.   Objective  Blood pressure 104/60, weight 165 lb (74.8 kg), last menstrual period 03/04/2019. Pregravid weight 152 lb (68.9 kg) Total Weight Gain 13 lb (5.897 kg) Urinalysis:      Fetal Status: Fetal Heart Rate (bpm): 155   Movement: Present     General:  Alert, oriented and cooperative. Patient is in no acute distress.  Skin: Skin is warm and dry. No rash noted.   Cardiovascular: Normal heart rate noted  Respiratory: Normal respiratory effort, no problems with respiration noted  Abdomen: Soft, gravid, appropriate for gestational age. Pain/Pressure: Present     Pelvic:  Cervical exam deferred       Tender along pubic symphysis   Extremities: Normal range of motion.     Mental Status: Normal mood and affect. Normal behavior. Normal judgment and thought content.     Assessment   33 y.o. G2P1001 at [redacted]w[redacted]d by  01/15/2020, by Ultrasound presenting for routine prenatal visit   Plan   Pregnancy #2 Problems (from 03/04/19 to present)    Problem Noted Resolved   Supervision of other normal pregnancy, antepartum 05/23/2019 by Rod Can, CNM No   Overview Addendum 08/26/2019  4:39 PM by Homero Fellers, MD    Clinic [facility] Prenatal Labs  Dating 7wk5d [place] Blood type: B/Positive/-- (07/06 0958)   Genetic Screen 1 Screen:    AFP:     Quad:     Myton XX Antibody:Negative (07/06 0958)  Anatomic [place]  Rubella: 9.67 (07/06 [id number]) Varicella:Immune  GTT Early:               Third trimester:  RPR: Non Reactive (07/06 0958)   Rhogam  not needed HBsAg: Negative (07/06 0958)   TDaP vaccine                        Flu Shot: Declines HIV: Non Reactive (07/06 0958)   Baby Food                                GBS:   Contraception  Pap: hx LSIL 2019 neg HR HPV  CBB     CS/VBAC NA Partial R mastectomy-borderline phyllodes tumor 2018  Support Person Lennette Bihari              Gestational age appropriate obstetric precautions including but not limited to vaginal bleeding, contractions, leaking of fluid and fetal movement were reviewed in detail with the patient.    Supportive care for pubic diastasis discussed. Declines referral for physical therapy, but will consider if supportive care does not help with pain.   Return  in about 4 weeks (around 09/23/2019) for ROB in person.  Homero Fellers MD [facility], [facility] 08/27/2019, 4:22 PM

## 2019-08-29 LAB — URINE CULTURE

## 2019-09-04 ENCOUNTER — Telehealth: Payer: Self-pay | Admitting: Obstetrics and Gynecology

## 2019-09-04 NOTE — Telephone Encounter (Signed)
Patient is calling for labs results. Please advise. 

## 2019-09-04 NOTE — Progress Notes (Signed)
WNL

## 2019-09-05 NOTE — Telephone Encounter (Signed)
Please call and tell her that her urine culture was normal- thank you

## 2019-09-06 NOTE — Telephone Encounter (Signed)
Pt aware.

## 2019-09-16 ENCOUNTER — Encounter: Payer: Self-pay | Admitting: Obstetrics & Gynecology

## 2019-09-16 ENCOUNTER — Other Ambulatory Visit: Payer: Self-pay

## 2019-09-16 ENCOUNTER — Ambulatory Visit (INDEPENDENT_AMBULATORY_CARE_PROVIDER_SITE_OTHER): Payer: PRIVATE HEALTH INSURANCE | Admitting: Obstetrics & Gynecology

## 2019-09-16 VITALS — BP 100/60 | Wt 164.0 lb

## 2019-09-16 DIAGNOSIS — Z3480 Encounter for supervision of other normal pregnancy, unspecified trimester: Secondary | ICD-10-CM

## 2019-09-16 DIAGNOSIS — Z3A22 22 weeks gestation of pregnancy: Secondary | ICD-10-CM

## 2019-09-16 DIAGNOSIS — B029 Zoster without complications: Secondary | ICD-10-CM

## 2019-09-16 DIAGNOSIS — Z131 Encounter for screening for diabetes mellitus: Secondary | ICD-10-CM

## 2019-09-16 DIAGNOSIS — Z3482 Encounter for supervision of other normal pregnancy, second trimester: Secondary | ICD-10-CM

## 2019-09-16 LAB — POCT URINALYSIS DIPSTICK OB
Glucose, UA: NEGATIVE
POC,PROTEIN,UA: NEGATIVE

## 2019-09-16 MED ORDER — VALACYCLOVIR HCL 1 G PO TABS: 1000.0000 mg | ORAL_TABLET | Freq: Three times a day (TID) | ORAL | 1 refills | Status: AC

## 2019-09-16 NOTE — Patient Instructions (Addendum)
Shingles  Shingles, which is also known as herpes zoster, is an infection that causes a painful skin rash and fluid-filled blisters. It is caused by a virus. Shingles only develops in people who:  Have had chickenpox.  Have been given a medicine to protect against chickenpox (have been vaccinated). Shingles is rare in this group. What are the causes? Shingles is caused by varicella-zoster virus (VZV). This is the same virus that causes chickenpox. After a person is exposed to VZV, the virus stays in the body in an inactive (dormant) state. Shingles develops if the virus is reactivated. This can happen many years after the first (initial) exposure to VZV. It is not known what causes this virus to be reactivated. What increases the risk? People who have had chickenpox or received the chickenpox vaccine are at risk for shingles. Shingles infection is more common in people who:  Are older than age 60.  Have a weakened disease-fighting system (immune system), such as people with: ? HIV. ? AIDS. ? Cancer.  Are taking medicines that weaken the immune system, such as transplant medicines.  Are experiencing a lot of stress. What are the signs or symptoms? Early symptoms of this condition include itching, tingling, and pain in an area on your skin. Pain may be described as burning, stabbing, or throbbing. A few days or weeks after early symptoms start, a painful red rash appears. The rash is usually on one side of the body and has a band-like or belt-like pattern. The rash eventually turns into fluid-filled blisters that break open, change into scabs, and dry up in about 2-3 weeks. At any time during the infection, you may also develop:  A fever.  Chills.  A headache.  An upset stomach. How is this diagnosed? This condition is diagnosed with a skin exam. Skin or fluid samples may be taken from the blisters before a diagnosis is made. These samples are examined under a microscope or sent to  a lab for testing. How is this treated? The rash may last for several weeks. There is not a specific cure for this condition. Your health care provider will probably prescribe medicines to help you manage pain, recover more quickly, and avoid long-term problems. Medicines may include:  Antiviral drugs.  Anti-inflammatory drugs.  Pain medicines.  Anti-itching medicines (antihistamines). If the area involved is on your face, you may be referred to a specialist, such as an eye doctor (ophthalmologist) or an ear, nose, and throat (ENT) doctor (otolaryngologist) to help you avoid eye problems, chronic pain, or disability. Follow these instructions at home: Medicines  Take over-the-counter and prescription medicines only as told by your health care provider.  Apply an anti-itch cream or numbing cream to the affected area as told by your health care provider. Relieving itching and discomfort   Apply cold, wet cloths (cold compresses) to the area of the rash or blisters as told by your health care provider.  Cool baths can be soothing. Try adding baking soda or dry oatmeal to the water to reduce itching. Do not bathe in hot water. Blister and rash care  Keep your rash covered with a loose bandage (dressing). Wear loose-fitting clothing to help ease the pain of material rubbing against the rash.  Keep your rash and blisters clean by washing the area with mild soap and cool water as told by your health care provider.  Check your rash every day for signs of infection. Check for: ? More redness, swelling, or pain. ? Fluid   or blood. ? Warmth. ? Pus or a bad smell.  Do not scratch your rash or pick at your blisters. To help avoid scratching: ? Keep your fingernails clean and cut short. ? Wear gloves or mittens while you sleep, if scratching is a problem. General instructions  Rest as told by your health care provider.  Keep all follow-up visits as told by your health care provider. This  is important.  Wash your hands often with soap and water. If soap and water are not available, use hand sanitizer. Doing this lowers your chance of getting a bacterial skin infection.  Before your blisters change into scabs, your shingles infection can cause chickenpox in people who have never had it or have never been vaccinated against it. To prevent this from happening, avoid contact with other people, especially: ? Babies. ? Pregnant women. ? Children who have eczema. ? Elderly people who have transplants. ? People who have chronic illnesses, such as cancer or AIDS. Contact a health care provider if:  Your pain is not relieved with prescribed medicines.  Your pain does not get better after the rash heals.  You have signs of infection in the rash area, such as: ? More redness, swelling, or pain around the rash. ? Fluid or blood coming from the rash. ? The rash area feeling warm to the touch. ? Pus or a bad smell coming from the rash. Get help right away if:  The rash is on your face or nose.  You have facial pain, pain around your eye area, or loss of feeling on one side of your face.  You have difficulty seeing.  You have ear pain or have ringing in your ear.  You have a loss of taste.  Your condition gets worse. Summary  Shingles, which is also known as herpes zoster, is an infection that causes a painful skin rash and fluid-filled blisters.  This condition is diagnosed with a skin exam. Skin or fluid samples may be taken from the blisters and examined before the diagnosis is made.  Keep your rash covered with a loose bandage (dressing). Wear loose-fitting clothing to help ease the pain of material rubbing against the rash.  Before your blisters change into scabs, your shingles infection can cause chickenpox in people who have never had it or have never been vaccinated against it. This information is not intended to replace advice given to you by your health care  provider. Make sure you discuss any questions you have with your health care provider. Document Released: 11/14/2005 Document Revised: 03/08/2019 Document Reviewed: 07/19/2017 Elsevier Patient Education  2020 [employer]  

## 2019-09-16 NOTE — Progress Notes (Signed)
  Subjective  Fetal Movement? yes Contractions? no Leaking Fluid? no Vaginal Bleeding? no Friday, she reports lesions around left chest and back, thought it was from bra but has persisted w rash and very painful to touch, no itching, no scaliness or bleeding or discharge Objective  BP 100/60   Wt 164 lb (74.4 kg)   LMP 03/04/2019 (Within Days)   BMI 30.99 kg/m  General: NAD Pumonary: no increased work of breathing Abdomen: gravid, non-tender Extremities: no edema Psychiatric: mood appropriate, affect full Skin: dermatone based skin rash w T to touch around left chest and back just under breast  Assessment  33 y.o. G2P1001 at [redacted]w[redacted]d by  01/15/2020, by Ultrasound presenting for routine prenatal visit  Plan   Problem List Items Addressed This Visit      Other   Supervision of other normal pregnancy, antepartum    Other Visit Diagnoses    [redacted] weeks gestation of pregnancy    -  Primary   Screening for diabetes mellitus       Relevant Orders   28 Week RH+Panel      Pregnancy #2 Problems (from 03/04/19 to present)    Problem Noted Resolved   Supervision of other normal pregnancy, antepartum 05/23/2019 by Rod Can, CNM No   Overview Addendum 09/16/2019  8:42 AM by Gae Dry, MD    Clinic [facility] Prenatal Labs  Dating 7wk5d [place] Blood type: B/Positive/-- (07/06 0958)   Genetic Screen    NIPS:diploid XX Antibody:Negative (07/06 0958)  Anatomic [place] Normal 9/28 Rubella: 9.67 (07/06 0958) Varicella:Immune  GTT Third trimester:  RPR: Non Reactive (07/06 0958)   Rhogam  not needed HBsAg: Negative (07/06 0958)   TDaP vaccine                        Flu Shot: Declines HIV: Non Reactive (07/06 0958)   Baby Food                                GBS:   Contraception  Pap: hx LSIL 2019 neg HR HPV  CBB  No   CS/VBAC NA Partial R mastectomy-borderline phyllodes tumor 2018  Support Person Lennette Bihari           Valtrex 1000 mg TID for shingles, as a clinical diagnosis today based on  pain, distribution of lesions, onset    Barnett Applebaum, MD, [facility], [facility] 09/16/2019  9:00 AM

## 2019-09-16 NOTE — Addendum Note (Signed)
Addended by: Quintella Baton D on: 09/16/2019 09:07 AM   Modules accepted: Orders

## 2019-09-17 ENCOUNTER — Telehealth: Payer: Self-pay

## 2019-09-17 NOTE — Telephone Encounter (Signed)
Patient inquiring if it is ok for her to take Benadryl. [id number]

## 2019-09-24 ENCOUNTER — Encounter: Payer: PRIVATE HEALTH INSURANCE | Admitting: Obstetrics and Gynecology

## 2019-10-14 ENCOUNTER — Other Ambulatory Visit: Payer: Self-pay

## 2019-10-14 ENCOUNTER — Encounter: Payer: Self-pay | Admitting: Obstetrics and Gynecology

## 2019-10-14 ENCOUNTER — Other Ambulatory Visit: Payer: PRIVATE HEALTH INSURANCE

## 2019-10-14 ENCOUNTER — Ambulatory Visit (INDEPENDENT_AMBULATORY_CARE_PROVIDER_SITE_OTHER): Payer: PRIVATE HEALTH INSURANCE | Admitting: Obstetrics and Gynecology

## 2019-10-14 VITALS — BP 118/66 | Wt 170.0 lb

## 2019-10-14 DIAGNOSIS — Z131 Encounter for screening for diabetes mellitus: Secondary | ICD-10-CM

## 2019-10-14 DIAGNOSIS — Z3480 Encounter for supervision of other normal pregnancy, unspecified trimester: Secondary | ICD-10-CM

## 2019-10-14 DIAGNOSIS — Z23 Encounter for immunization: Secondary | ICD-10-CM

## 2019-10-14 DIAGNOSIS — Z3A26 26 weeks gestation of pregnancy: Secondary | ICD-10-CM

## 2019-10-14 DIAGNOSIS — Z3482 Encounter for supervision of other normal pregnancy, second trimester: Secondary | ICD-10-CM

## 2019-10-14 NOTE — Patient Instructions (Addendum)
Second Trimester of Pregnancy The second trimester is from week 14 through week 27 (months 4 through 6). The second trimester is often a time when you feel your best. Your body has adjusted to being pregnant, and you begin to feel better physically. Usually, morning sickness has lessened or quit completely, you may have more energy, and you may have an increase in appetite. The second trimester is also a time when the fetus is growing rapidly. At the end of the sixth month, the fetus is about 9 inches long and weighs about 1 pounds. You will likely begin to feel the baby move (quickening) between 16 and 20 weeks of pregnancy. Body changes during your second trimester Your body continues to go through many changes during your second trimester. The changes vary from woman to woman.  Your weight will continue to increase. You will notice your lower abdomen bulging out.  You may begin to get stretch marks on your hips, abdomen, and breasts.  You may develop headaches that can be relieved by medicines. The medicines should be approved by your health care provider.  You may urinate more often because the fetus is pressing on your bladder.  You may develop or continue to have heartburn as a result of your pregnancy.  You may develop constipation because certain hormones are causing the muscles that push waste through your intestines to slow down.  You may develop hemorrhoids or swollen, bulging veins (varicose veins).  You may have back pain. This is caused by: ? Weight gain. ? Pregnancy hormones that are relaxing the joints in your pelvis. ? A shift in weight and the muscles that support your balance.  Your breasts will continue to grow and they will continue to become tender.  Your gums may bleed and may be sensitive to brushing and flossing.  Dark spots or blotches (chloasma, mask of pregnancy) may develop on your face. This will likely fade after the baby is born.  A dark line from your  belly button to the pubic area (linea nigra) may appear. This will likely fade after the baby is born.  You may have changes in your hair. These can include thickening of your hair, rapid growth, and changes in texture. Some women also have hair loss during or after pregnancy, or hair that feels dry or thin. Your hair will most likely return to normal after your baby is born. What to expect at prenatal visits During a routine prenatal visit:  You will be weighed to make sure you and the fetus are growing normally.  Your blood pressure will be taken.  Your abdomen will be measured to track your baby's growth.  The fetal heartbeat will be listened to.  Any test results from the previous visit will be discussed. Your health care provider may ask you:  How you are feeling.  If you are feeling the baby move.  If you have had any abnormal symptoms, such as leaking fluid, bleeding, severe headaches, or abdominal cramping.  If you are using any tobacco products, including cigarettes, chewing tobacco, and electronic cigarettes.  If you have any questions. Other tests that may be performed during your second trimester include:  Blood tests that check for: ? Low iron levels (anemia). ? High blood sugar that affects pregnant women (gestational diabetes) between 35 and 28 weeks. ? Rh antibodies. This is to check for a protein on red blood cells (Rh factor).  Urine tests to check for infections, diabetes, or protein in the  urine.  An ultrasound to confirm the proper growth and development of the baby.  An amniocentesis to check for possible genetic problems.  Fetal screens for spina bifida and Down syndrome.  HIV (human immunodeficiency virus) testing. Routine prenatal testing includes screening for HIV, unless you choose not to have this test. Follow these instructions at home: Medicines  Follow your health care provider's instructions regarding medicine use. Specific medicines may be  either safe or unsafe to take during pregnancy.  Take a prenatal vitamin that contains at least 600 micrograms (mcg) of folic acid.  If you develop constipation, try taking a stool softener if your health care provider approves. Eating and drinking   Eat a balanced diet that includes fresh fruits and vegetables, whole grains, good sources of protein such as meat, eggs, or tofu, and low-fat dairy. Your health care provider will help you determine the amount of weight gain that is right for you.  Avoid raw meat and uncooked cheese. These carry germs that can cause birth defects in the baby.  If you have low calcium intake from food, talk to your health care provider about whether you should take a daily calcium supplement.  Limit foods that are high in fat and processed sugars, such as fried and sweet foods.  To prevent constipation: ? Drink enough fluid to keep your urine clear or pale yellow. ? Eat foods that are high in fiber, such as fresh fruits and vegetables, whole grains, and beans. Activity  Exercise only as directed by your health care provider. Most women can continue their usual exercise routine during pregnancy. Try to exercise for 30 minutes at least 5 days a week. Stop exercising if you experience uterine contractions.  Avoid heavy lifting, wear low heel shoes, and practice good posture.  A sexual relationship may be continued unless your health care provider directs you otherwise. Relieving pain and discomfort  Wear a good support bra to prevent discomfort from breast tenderness.  Take warm sitz baths to soothe any pain or discomfort caused by hemorrhoids. Use hemorrhoid cream if your health care provider approves.  Rest with your legs elevated if you have leg cramps or low back pain.  If you develop varicose veins, wear support hose. Elevate your feet for 15 minutes, 3-4 times a day. Limit salt in your diet. Prenatal Care  Write down your questions. Take them to  your prenatal visits.  Keep all your prenatal visits as told by your health care provider. This is important. Safety  Wear your seat belt at all times when driving.  Make a list of emergency phone numbers, including numbers for family, friends, the hospital, and police and fire departments. General instructions  Ask your health care provider for a referral to a local prenatal education class. Begin classes no later than the beginning of month 6 of your pregnancy.  Ask for help if you have counseling or nutritional needs during pregnancy. Your health care provider can offer advice or refer you to specialists for help with various needs.  Do not use hot tubs, steam rooms, or saunas.  Do not douche or use tampons or scented sanitary pads.  Do not cross your legs for long periods of time.  Avoid cat litter boxes and soil used by cats. These carry germs that can cause birth defects in the baby and possibly loss of the fetus by miscarriage or stillbirth.  Avoid all smoking, herbs, alcohol, and unprescribed drugs. Chemicals in these products can affect the formation  and growth of the baby.  Do not use any products that contain nicotine or tobacco, such as cigarettes and e-cigarettes. If you need help quitting, ask your health care provider.  Visit your dentist if you have not gone yet during your pregnancy. Use a soft toothbrush to brush your teeth and be gentle when you floss. Contact a health care provider if:  You have dizziness.  You have mild pelvic cramps, pelvic pressure, or nagging pain in the abdominal area.  You have persistent nausea, vomiting, or diarrhea.  You have a bad smelling vaginal discharge.  You have pain when you urinate. Get help right away if:  You have a fever.  You are leaking fluid from your vagina.  You have spotting or bleeding from your vagina.  You have severe abdominal cramping or pain.  You have rapid weight gain or weight loss.  You have  shortness of breath with chest pain.  You notice sudden or extreme swelling of your face, hands, ankles, feet, or legs.  You have not felt your baby move in over an hour.  You have severe headaches that do not go away when you take medicine.  You have vision changes. Summary  The second trimester is from week 14 through week 27 (months 4 through 6). It is also a time when the fetus is growing rapidly.  Your body goes through many changes during pregnancy. The changes vary from woman to woman.  Avoid all smoking, herbs, alcohol, and unprescribed drugs. These chemicals affect the formation and growth your baby.  Do not use any tobacco products, such as cigarettes, chewing tobacco, and e-cigarettes. If you need help quitting, ask your health care provider.  Contact your health care provider if you have any questions. Keep all prenatal visits as told by your health care provider. This is important. This information is not intended to replace advice given to you by your health care provider. Make sure you discuss any questions you have with your health care provider. Document Released: 11/08/2001 Document Revised: 03/08/2019 Document Reviewed: 12/20/2016 Else[employer] Patient Education  [place] of Pregnancy The third trimester is from week 28 through week 40 (months 7 through 9). The third trimester is a time when the unborn baby (fetus) is growing rapidly. At the end of the ninth month, the fetus is about 20 inches in length and weighs 6-10 pounds. Body changes during your third trimester Your body will continue to go through many changes during pregnancy. The changes vary from woman to woman. During the third trimester:  Your weight will continue to increase. You can expect to gain 25-35 pounds (11-16 kg) by the end of the pregnancy.  You may begin to get stretch marks on your hips, abdomen, and breasts.  You may urinate more often because the fetus is moving  lower into your pelvis and pressing on your bladder.  You may develop or continue to have heartburn. This is caused by increased hormones that slow down muscles in the digestive tract.  You may develop or continue to have constipation because increased hormones slow digestion and cause the muscles that push waste through your intestines to relax.  You may develop hemorrhoids. These are swollen veins (varicose veins) in the rectum that can itch or be painful.  You may develop swollen, bulging veins (varicose veins) in your legs.  You may have increased body aches in the pelvis, back, or thighs. This is due to weight gain  and increased hormones that are relaxing your joints.  You may have changes in your hair. These can include thickening of your hair, rapid growth, and changes in texture. Some women also have hair loss during or after pregnancy, or hair that feels dry or thin. Your hair will most likely return to normal after your baby is born.  Your breasts will continue to grow and they will continue to become tender. A yellow fluid (colostrum) may leak from your breasts. This is the first milk you are producing for your baby.  Your belly button may stick out.  You may notice more swelling in your hands, face, or ankles.  You may have increased tingling or numbness in your hands, arms, and legs. The skin on your belly may also feel numb.  You may feel short of breath because of your expanding uterus.  You may have more problems sleeping. This can be caused by the size of your belly, increased need to urinate, and an increase in your body's metabolism.  You may notice the fetus "dropping," or moving lower in your abdomen (lightening).  You may have increased vaginal discharge.  You may notice your joints feel loose and you may have pain around your pelvic bone. What to expect at prenatal visits You will have prenatal exams every 2 weeks until week 36. Then you will have weekly  prenatal exams. During a routine prenatal visit:  You will be weighed to make sure you and the baby are growing normally.  Your blood pressure will be taken.  Your abdomen will be measured to track your baby's growth.  The fetal heartbeat will be listened to.  Any test results from the previous visit will be discussed.  You may have a cervical check near your due date to see if your cervix has softened or thinned (effaced).  You will be tested for Group B streptococcus. This happens between 35 and 37 weeks. Your health care provider may ask you:  What your birth plan is.  How you are feeling.  If you are feeling the baby move.  If you have had any abnormal symptoms, such as leaking fluid, bleeding, severe headaches, or abdominal cramping.  If you are using any tobacco products, including cigarettes, chewing tobacco, and electronic cigarettes.  If you have any questions. Other tests or screenings that may be performed during your third trimester include:  Blood tests that check for low iron levels (anemia).  Fetal testing to check the health, activity level, and growth of the fetus. Testing is done if you have certain medical conditions or if there are problems during the pregnancy.  Nonstress test (NST). This test checks the health of your baby to make sure there are no signs of problems, such as the baby not getting enough oxygen. During this test, a belt is placed around your belly. The baby is made to move, and its heart rate is monitored during movement. What is false labor? False labor is a condition in which you feel small, irregular tightenings of the muscles in the womb (contractions) that usually go away with rest, changing position, or drinking water. These are called Braxton Hicks contractions. Contractions may last for hours, days, or even weeks before true labor sets in. If contractions come at regular intervals, become more frequent, increase in intensity, or become  painful, you should see your health care provider. What are the signs of labor?  Abdominal cramps.  Regular contractions that start at 10 minutes apart and  become stronger and more frequent with time.  Contractions that start on the top of the uterus and spread down to the lower abdomen and back.  Increased pelvic pressure and dull back pain.  A watery or bloody mucus discharge that comes from the vagina.  Leaking of amniotic fluid. This is also known as your "water breaking." It could be a slow trickle or a gush. Let your health care provider know if it has a color or strange odor. If you have any of these signs, call your health care provider right away, even if it is before your due date. Follow these instructions at home: Medicines  Follow your health care provider's instructions regarding medicine use. Specific medicines may be either safe or unsafe to take during pregnancy.  Take a prenatal vitamin that contains at least 600 micrograms (mcg) of folic acid.  If you develop constipation, try taking a stool softener if your health care provider approves. Eating and drinking   Eat a balanced diet that includes fresh fruits and vegetables, whole grains, good sources of protein such as meat, eggs, or tofu, and low-fat dairy. Your health care provider will help you determine the amount of weight gain that is right for you.  Avoid raw meat and uncooked cheese. These carry germs that can cause birth defects in the baby.  If you have low calcium intake from food, talk to your health care provider about whether you should take a daily calcium supplement.  Eat four or five small meals rather than three large meals a day.  Limit foods that are high in fat and processed sugars, such as fried and sweet foods.  To prevent constipation: ? Drink enough fluid to keep your urine clear or pale yellow. ? Eat foods that are high in fiber, such as fresh fruits and vegetables, whole grains, and  beans. Activity  Exercise only as directed by your health care provider. Most women can continue their usual exercise routine during pregnancy. Try to exercise for 30 minutes at least 5 days a week. Stop exercising if you experience uterine contractions.  Avoid heavy lifting.  Do not exercise in extreme heat or humidity, or at high altitudes.  Wear low-heel, comfortable shoes.  Practice good posture.  You may continue to have sex unless your health care provider tells you otherwise. Relieving pain and discomfort  Take frequent breaks and rest with your legs elevated if you have leg cramps or low back pain.  Take warm sitz baths to soothe any pain or discomfort caused by hemorrhoids. Use hemorrhoid cream if your health care provider approves.  Wear a good support bra to prevent discomfort from breast tenderness.  If you develop varicose veins: ? Wear support pantyhose or compression stockings as told by your healthcare provider. ? Elevate your feet for 15 minutes, 3-4 times a day. Prenatal care  Write down your questions. Take them to your prenatal visits.  Keep all your prenatal visits as told by your health care provider. This is important. Safety  Wear your seat belt at all times when driving.  Make a list of emergency phone numbers, including numbers for family, friends, the hospital, and police and fire departments. General instructions  Avoid cat litter boxes and soil used by cats. These carry germs that can cause birth defects in the baby. If you have a cat, ask someone to clean the litter box for you.  Do not travel far distances unless it is absolutely necessary and only with the  approval of your health care provider.  Do not use hot tubs, steam rooms, or saunas.  Do not drink alcohol.  Do not use any products that contain nicotine or tobacco, such as cigarettes and e-cigarettes. If you need help quitting, ask your health care provider.  Do not use any medicinal  herbs or unprescribed drugs. These chemicals affect the formation and growth of the baby.  Do not douche or use tampons or scented sanitary pads.  Do not cross your legs for long periods of time.  To prepare for the arrival of your baby: ? Take prenatal classes to understand, practice, and ask questions about labor and delivery. ? Make a trial run to the hospital. ? Visit the hospital and tour the maternity area. ? Arrange for maternity or paternity leave through employers. ? Arrange for family and friends to take care of pets while you are in the hospital. ? Purchase a rear-facing car seat and make sure you know how to install it in your car. ? Pack your hospital bag. ? Prepare the babys nursery. Make sure to remove all pillows and stuffed animals from the baby's crib to prevent suffocation.  Visit your dentist if you have not gone during your pregnancy. Use a soft toothbrush to brush your teeth and be gentle when you floss. Contact a health care provider if:  You are unsure if you are in labor or if your water has broken.  You become dizzy.  You have mild pelvic cramps, pelvic pressure, or nagging pain in your abdominal area.  You have lower back pain.  You have persistent nausea, vomiting, or diarrhea.  You have an unusual or bad smelling vaginal discharge.  You have pain when you urinate. Get help right away if:  Your water breaks before 37 weeks.  You have regular contractions less than 5 minutes apart before 37 weeks.  You have a fever.  You are leaking fluid from your vagina.  You have spotting or bleeding from your vagina.  You have severe abdominal pain or cramping.  You have rapid weight loss or weight gain.  You have shortness of breath with chest pain.  You notice sudden or extreme swelling of your face, hands, ankles, feet, or legs.  Your baby makes fewer than 10 movements in 2 hours.  You have severe headaches that do not go away when you take  medicine.  You have vision changes. Summary  The third trimester is from week 28 through week 40, months 7 through 9. The third trimester is a time when the unborn baby (fetus) is growing rapidly.  During the third trimester, your discomfort may increase as you and your baby continue to gain weight. You may have abdominal, leg, and back pain, sleeping problems, and an increased need to urinate.  During the third trimester your breasts will keep growing and they will continue to become tender. A yellow fluid (colostrum) may leak from your breasts. This is the first milk you are producing for your baby.  False labor is a condition in which you feel small, irregular tightenings of the muscles in the womb (contractions) that eventually go away. These are called Braxton Hicks contractions. Contractions may last for hours, days, or even weeks before true labor sets in.  Signs of labor can include: abdominal cramps; regular contractions that start at 10 minutes apart and become stronger and more frequent with time; watery or bloody mucus discharge that comes from the vagina; increased pelvic pressure and dull  back pain; and leaking of amniotic fluid. This information is not intended to replace advice given to you by your health care provider. Make sure you discuss any questions you have with your health care provider. Document Released: 11/08/2001 Document Revised: 03/07/2019 Document Reviewed: 12/20/2016 Elsevier Patient Education  2020 [employer].

## 2019-10-14 NOTE — Progress Notes (Signed)
ROB/GTT No concern Denies lof, no vb Good FM

## 2019-10-14 NOTE — Progress Notes (Signed)
Routine Prenatal Care Visit  Subjective  Heather Horn is a 33 y.o. G2P1001 at [redacted]w[redacted]d being seen today for ongoing prenatal care.  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  ----------------------------------------------------------------------------------- Patient reports no complaints.   Contractions: Not present. Vag. Bleeding: None.  Movement: Present. Denies leaking of fluid.  ----------------------------------------------------------------------------------- The following portions of the patient's history were reviewed and updated as appropriate: allergies, current medications, past family history, past medical history, past social history, past surgical history and problem list. Problem list updated.   Objective  Blood pressure 118/66, weight 170 lb (77.1 kg), last menstrual period 03/04/2019. Pregravid weight 152 lb (68.9 kg) Total Weight Gain 18 lb (8.165 kg) Urinalysis:      Fetal Status: Fetal Heart Rate (bpm): 140 Fundal Height: 27 cm Movement: Present     General:  Alert, oriented and cooperative. Patient is in no acute distress.  Skin: Skin is warm and dry. No rash noted.   Cardiovascular: Normal heart rate noted  Respiratory: Normal respiratory effort, no problems with respiration noted  Abdomen: Soft, gravid, appropriate for gestational age. Pain/Pressure: Absent     Pelvic:  Cervical exam deferred        Extremities: Normal range of motion.  Edema: None  Mental Status: Normal mood and affect. Normal behavior. Normal judgment and thought content.     Assessment   33 y.o. G2P1001 at [redacted]w[redacted]d by  01/15/2020, by Ultrasound presenting for routine prenatal visit  Plan   Pregnancy #2 Problems (from 03/04/19 to present)    Problem Noted Resolved   Supervision of other normal pregnancy,  antepartum 05/23/2019 by Rod Can, CNM No   Overview Addendum 09/16/2019  8:42 AM by Gae Dry, MD    [facility] Prenatal Labs  Dating 7wk5d [place] Blood type: B/Positive/-- (07/06 0958)   Genetic Screen    NIPS:diploid XX Antibody:Negative (07/06 0958)  Anatomic [place] Normal 9/28 Rubella: 9.67 (07/06 0958) Varicella:Immune  GTT Third trimester:  RPR: Non Reactive (07/06 0958)   Rhogam  not needed HBsAg: Negative (07/06 0958)   TDaP vaccine                        Flu Shot: Declines HIV: Non Reactive (07/06 0958)   Baby Food                                GBS:   Contraception  Pap: hx LSIL 2019 neg HR HPV  CBB  No   CS/VBAC NA Partial R mastectomy-borderline phyllodes tumor 2018  Support Person Lennette Bihari              Gestational age appropriate obstetric precautions including but not limited to vaginal bleeding, contractions, leaking of fluid and fetal movement were reviewed in detail with the patient.    She is considering being out of town until January. Discussed that we normally see people every 2 weeks at this point of pregnancy. She would prefer phone visits. Recommended getting a home BP cuff to check BP and to alert [place] if she has elevated values more than 140/90.  TDAP today.   Return in about 4 weeks (around 11/11/2019) for [place] telephone.  Homero Fellers MD [facility] 10/14/2019, 9:46 AM

## 2019-10-14 NOTE — Addendum Note (Signed)
Addended by: Raechel Chute on: 10/14/2019 10:59 AM   Modules accepted: Orders

## 2019-10-15 LAB — 28 WEEK RH+PANEL
Basophils Absolute: 0 10*3/uL (ref 0.0–0.2)
Basos: 0 %
EOS (ABSOLUTE): 0.2 10*3/uL (ref 0.0–0.4)
Eos: 1 %
Gestational Diabetes Screen: 95 mg/dL (ref 65–139)
HIV Screen 4th Generation wRfx: NONREACTIVE
Hematocrit: 35.5 % (ref 34.0–46.6)
Hemoglobin: 11.7 g/dL (ref 11.1–15.9)
Immature Grans (Abs): 0.1 10*3/uL (ref 0.0–0.1)
Immature Granulocytes: 1 %
Lymphocytes Absolute: 1.8 10*3/uL (ref 0.7–3.1)
Lymphs: 13 %
MCH: 29.9 pg (ref 26.6–33.0)
MCHC: 33 g/dL (ref 31.5–35.7)
MCV: 91 fL (ref 79–97)
Monocytes Absolute: 0.8 10*3/uL (ref 0.1–0.9)
Monocytes: 6 %
Neutrophils Absolute: 10.7 10*3/uL — ABNORMAL HIGH (ref 1.4–7.0)
Neutrophils: 79 %
Platelets: 317 10*3/uL (ref 150–450)
RBC: 3.91 x10E6/uL (ref 3.77–5.28)
RDW: 12.5 % (ref 11.7–15.4)
RPR Ser Ql: NONREACTIVE
WBC: 13.7 10*3/uL — ABNORMAL HIGH (ref 3.4–10.8)

## 2019-11-11 ENCOUNTER — Ambulatory Visit (INDEPENDENT_AMBULATORY_CARE_PROVIDER_SITE_OTHER): Payer: PRIVATE HEALTH INSURANCE | Admitting: Certified Nurse Midwife

## 2019-11-11 ENCOUNTER — Other Ambulatory Visit: Payer: Self-pay

## 2019-11-11 DIAGNOSIS — Z3480 Encounter for supervision of other normal pregnancy, unspecified trimester: Secondary | ICD-10-CM

## 2019-11-11 DIAGNOSIS — Z3A30 30 weeks gestation of pregnancy: Secondary | ICD-10-CM

## 2019-11-11 DIAGNOSIS — Z3483 Encounter for supervision of other normal pregnancy, third trimester: Secondary | ICD-10-CM

## 2019-11-17 NOTE — Progress Notes (Signed)
Routine Prenatal Care Visit- Virtual Visit  Subjective   Virtual Visit via Telephone Note  I connected with Heather Horn on 11/17/19 at  2:50 PM EST by telephone and verified that I am speaking with the correct person using two identifiers.   I discussed the limitations, risks, security and privacy concerns of performing an evaluation and management service by telephone and the availability of in person appointments. I also discussed with the patient that there may be a patient responsible charge related to this service. The patient expressed understanding and agreed to proceed.  The patient was at home I spoke with the patient from my  workstation The names of people involved in this encounter were: Velva Harman , and [place] , and [place] .   Heather Horn is a 33 y.o. G2P1001 at 30wk5d being seen today for ongoing prenatal care.  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  ----------------------------------------------------------------------------------- Patient reports heartburn and ankle edema in the PM..  Denies headaches or visual changes or RUQ pain Contractions: Not present. Vag. Bleeding: None.  Movement: Present. Denies leaking of fluid.  ----------------------------------------------------------------------------------- The following portions of the patient's history were reviewed and updated as appropriate: allergies, current medications, past family history, past medical history, past social history, past surgical history and problem list. Problem list updated.   Objective  Last menstrual period 03/04/2019. Pregravid weight 152 lb (68.9 kg) Total Weight Gain 18 lb (8.165 kg) Urinalysis: no specimen Fetal Status:     Movement: Present     Physical Exam could not be performed. Because of the  COVID-19 outbreak this visit was performed over the phone and not in person.   Assessment   33 y.o. G2P1001 at [redacted]w[redacted]d by  01/15/2020, by Ultrasound presenting for routine prenatal visit  Plan   Pregnancy #2 Problems (from 03/04/19 to present)    Problem Noted Resolved   Supervision of other normal pregnancy, antepartum 05/23/2019 by Rod Can, CNM No   Overview Addendum 10/14/2019  9:48 AM by Homero Fellers, MD    [facility]inic [facility] Prenatal Labs  Dating 7wk5d [place] Blood type: B/Positive/-- (07/06 0958)   Genetic Screen    NIPS:diploid XX Antibody:Negative (07/06 0958)  Anatomic [place] Normal 9/28 Rubella: 9.67 (07/06 0958) Varicella:Immune  GTT Third trimester: 95 RPR: Non Reactive (07/06 0958)   Rhogam  not needed HBsAg: Negative (07/06 0958)   TDaP vaccine   10/14/2019                     Flu Shot: Declines HIV: Non Reactive (07/06 0958)   Baby Food             Bottle                   GBS:   Contraception  Pap: hx LSIL 2019 neg HR HPV  CBB  No   CS/VBAC NA Partial R mastectomy-borderline phyllodes tumor 2018  Support Person Lennette Bihari              Gestational age appropriate obstetric precautions including but not limited to vaginal bleeding, contractions, leaking of fluid and fetal movement were reviewed in detail with the patient.     Follow Up Instructions:  ROB in 2- 3 weeks I discussed the assessment and treatment plan with the patient. The patient was provided an opportunity  to ask questions and all were answered. The patient agreed with the plan and demonstrated an understanding of the instructions.   The patient was advised to call back or seek an in-person evaluation if the symptoms worsen or if the condition fails to improve as anticipated.  I provided 10 minutes of non-face-to-face time during this encounter.   Dalia Heading, CNM [facility]

## 2019-11-25 ENCOUNTER — Telehealth: Payer: Self-pay

## 2019-11-25 NOTE — Telephone Encounter (Signed)
Pt called triage today because she has started to have cold like symptoms, Fever, Body aches, congestion and a slight cough. Pt has had covid testing this morning but wanted to know what to take for her symptoms I have advised pt she can take tylenol extra strength for body aches and fever or tylenol pm for pain and to help her sleep as well as unisom to help her sleep, she can also take sudafed for congestion or mucinex. Pt aware to give [place] a call when her results are back so that we can r/s her appt if needed.

## 2019-11-29 ENCOUNTER — Encounter: Payer: Self-pay | Admitting: Emergency Medicine

## 2019-11-29 ENCOUNTER — Emergency Department
Admission: EM | Admit: 2019-11-29 | Discharge: 2019-11-29 | Disposition: A | Discharge status: Home / Self Care | Payer: PRIVATE HEALTH INSURANCE | Attending: Emergency Medicine | Admitting: Emergency Medicine

## 2019-11-29 ENCOUNTER — Emergency Department: Payer: PRIVATE HEALTH INSURANCE

## 2019-11-29 ENCOUNTER — Other Ambulatory Visit: Payer: Self-pay

## 2019-11-29 DIAGNOSIS — U071 COVID-19: Secondary | ICD-10-CM | POA: Insufficient documentation

## 2019-11-29 DIAGNOSIS — R0602 Shortness of breath: Secondary | ICD-10-CM | POA: Insufficient documentation

## 2019-11-29 DIAGNOSIS — O98513 Other viral diseases complicating pregnancy, third trimester: Secondary | ICD-10-CM | POA: Diagnosis not present

## 2019-11-29 DIAGNOSIS — Z3A00 Weeks of gestation of pregnancy not specified: Secondary | ICD-10-CM | POA: Diagnosis not present

## 2019-11-29 DIAGNOSIS — O99891 Other specified diseases and conditions complicating pregnancy: Secondary | ICD-10-CM | POA: Diagnosis present

## 2019-11-29 DIAGNOSIS — Z3493 Encounter for supervision of normal pregnancy, unspecified, third trimester: Secondary | ICD-10-CM

## 2019-11-29 LAB — CBC WITH DIFFERENTIAL/PLATELET
Abs Immature Granulocytes: 0.11 10*3/uL — ABNORMAL HIGH (ref 0.00–0.07)
Basophils Absolute: 0 10*3/uL (ref 0.0–0.1)
Basophils Relative: 0 %
Eosinophils Absolute: 0 10*3/uL (ref 0.0–0.5)
Eosinophils Relative: 0 %
HCT: 35.4 % — ABNORMAL LOW (ref 36.0–46.0)
Hemoglobin: 11.8 g/dL — ABNORMAL LOW (ref 12.0–15.0)
Immature Granulocytes: 1 %
Lymphocytes Relative: 10 %
Lymphs Abs: 1.3 10*3/uL (ref 0.7–4.0)
MCH: 28.7 pg (ref 26.0–34.0)
MCHC: 33.3 g/dL (ref 30.0–36.0)
MCV: 86.1 fL (ref 80.0–100.0)
Monocytes Absolute: 0.5 10*3/uL (ref 0.1–1.0)
Monocytes Relative: 4 %
Neutro Abs: 11.3 10*3/uL — ABNORMAL HIGH (ref 1.7–7.7)
Neutrophils Relative %: 85 %
Platelets: 220 10*3/uL (ref 150–400)
RBC: 4.11 MIL/uL (ref 3.87–5.11)
RDW: 12.7 % (ref 11.5–15.5)
WBC: 13.2 10*3/uL — ABNORMAL HIGH (ref 4.0–10.5)
nRBC: 0 % (ref 0.0–0.2)

## 2019-11-29 LAB — BASIC METABOLIC PANEL WITH GFR
Anion gap: 12 (ref 5–15)
BUN: <5 mg/dL — ABNORMAL LOW (ref 6–20)
CO2: 19 mmol/L — ABNORMAL LOW (ref 22–32)
Calcium: 8.1 mg/dL — ABNORMAL LOW (ref 8.9–10.3)
Chloride: 104 mmol/L (ref 98–111)
Creatinine, Ser: 0.5 mg/dL (ref 0.44–1.00)
GFR calc Af Amer: >60 mL/min
GFR calc non Af Amer: >60 mL/min
Glucose, Bld: 106 mg/dL — ABNORMAL HIGH (ref 70–99)
Potassium: 2.6 mmol/L — CL (ref 3.5–5.1)
Sodium: 135 mmol/L (ref 135–145)

## 2019-11-29 LAB — HCG, QUANTITATIVE, PREGNANCY: hCG, Beta Chain, Quant, S: 33616 m[IU]/mL — ABNORMAL HIGH

## 2019-11-29 LAB — BRAIN NATRIURETIC PEPTIDE: B Natriuretic Peptide: 33 pg/mL (ref 0.0–100.0)

## 2019-11-29 MED ORDER — SODIUM CHLORIDE 0.9 % IV BOLUS
500.0000 mL | Freq: Once | INTRAVENOUS | Status: AC
  Administered 2019-11-29: 13:00:00 500 mL via INTRAVENOUS

## 2019-11-29 MED ORDER — KCL IN DEXTROSE-NACL 40-5-0.45 MEQ/L-%-% IV SOLN
INTRAVENOUS | Status: DC
  Administered 2019-11-29: 1000 mL via INTRAVENOUS
  Filled 2019-11-29 (×7): qty 1000

## 2019-11-29 MED ORDER — METHYLPREDNISOLONE SODIUM SUCC 125 MG IJ SOLR
125.0000 mg | Freq: Once | INTRAMUSCULAR | Status: AC
  Administered 2019-11-29: 125 mg via INTRAVENOUS
  Filled 2019-11-29: qty 2

## 2019-11-29 MED ORDER — ACETAMINOPHEN 500 MG PO TABS: 1000.0000 mg | ORAL_TABLET | Freq: Once | ORAL | Status: AC

## 2019-11-29 MED ORDER — POTASSIUM CHLORIDE CRYS ER 20 MEQ PO TBCR
40.0000 meq | EXTENDED_RELEASE_TABLET | Freq: Once | ORAL | Status: AC
  Administered 2019-11-29: 18:00:00 40 meq via ORAL
  Filled 2019-11-29: qty 2

## 2019-11-29 MED ORDER — ACETAMINOPHEN 500 MG PO TABS
ORAL_TABLET | ORAL | Status: AC
  Administered 2019-11-29: 1000 mg via ORAL
  Filled 2019-11-29: qty 2

## 2019-11-29 MED ORDER — METHYLPREDNISOLONE 4 MG PO TBPK: ORAL_TABLET | ORAL | 0 refills | Status: DC

## 2019-11-29 NOTE — ED Provider Notes (Signed)
[facility] Emergency Department Provider Note       Time seen: ----------------------------------------- 1:23 PM on 11/29/2019 ----------------------------------------- I have reviewed the triage vital signs and the nursing notes.  HISTORY   Chief Complaint Fever and covid +   HPI Heather Horn is a 34 y.o. female with a history of gestational hypertension who presents to the ED for fever, cough, shortness of breath and joint pain.  Patient states she is Covid positive, tested +5 days ago.  She states she is 8 months pregnant as well, last took Tylenol at 4 AM.  Past Medical History:  Diagnosis Date  . Gestational hypertension   . Phyllodes tumor of breast 2019    Patient Active Problem List   Diagnosis Date Noted  . Supervision of other normal pregnancy, antepartum 05/23/2019  . 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 MASTECTOMY, PARTIAL Right 09/01/2018    Allergies Patient has no known allergies.  Social History Social History   Tobacco Use  . Smoking status: Never Smoker  . Smokeless tobacco: Never Used  Substance Use Topics  . Alcohol use: Not Currently  . Drug use: Never   Review of Systems Constitutional: Positive for fever Cardiovascular: Negative for chest pain Respiratory: Positive for cough and shortness of breath Gastrointestinal: Negative for abdominal pain, vomiting and diarrhea. Musculoskeletal: Negative for back pain.  Positive for joint pain Skin: Negative for rash. Neurological: Negative for headaches, focal weakness or numbness.  All systems negative/normal/unremarkable except as stated in the HPI  ____________________________________________   PHYSICAL EXAM:  VITAL SIGNS: ED Triage Vitals  Enc Vitals Group     BP 11/29/19 1232 138/84     Pulse Rate 11/29/19 1232 (!) 140     Resp 11/29/19 1232 (!) 36     Temp 11/29/19  1232 (!) 103 F (39.4 C)     Temp Source 11/29/19 1232 Oral     SpO2 11/29/19 1232 94 %     Weight 11/29/19 1231 170 lb (77.1 kg)     Height 11/29/19 1231 5\' 1"  (1.549 m)     Head Circumference --      Peak Flow --      Pain Score 11/29/19 1231 7     Pain Loc --      Pain Edu? --      Excl. in [place]? --     Constitutional: Alert and oriented. Well appearing and in no distress. Eyes: Conjunctivae are normal. Normal extraocular movements. ENT      Head: Normocephalic and atraumatic.      Nose: No congestion/rhinnorhea.      Mouth/Throat: Mucous membranes are moist.      Neck: No stridor. Cardiovascular: Rapid rate, regular rhythm. No murmurs, rubs, or gallops. Respiratory: Tachypnea with clear breath sounds Gastrointestinal: Soft and nontender. Normal bowel sounds Musculoskeletal: Nontender with normal range of motion in extremities. No lower extremity tenderness nor edema. Neurologic:  Normal speech and language. No gross focal neurologic deficits are appreciated.  Skin:  Skin is warm, dry and intact. No rash noted. Psychiatric: Mood and affect are normal. Speech and behavior are normal.  ____________________________________________  EKG: Interpreted by me.  Sinus tachycardia with a rate of 122 bpm, normal PR interval, normal axis, long QT  ____________________________________________  ED COURSE:  As part of my medical decision making, I reviewed the following data within the [place] History obtained from  family if available, nursing notes, old chart and ekg, as well as notes from prior ED visits. Patient presented for pregnancy and COVID-19, we will assess with labs and imaging as indicated at this time.   Procedures  Heather Horn was evaluated in Emergency Department on 11/29/2019 for the symptoms described in the history of present illness. She was evaluated in the context of the global COVID-19 pandemic, which necessitated consideration that the patient  might be at risk for infection with the SARS-CoV-2 virus that causes COVID-19. Institutional protocols and algorithms that pertain to the evaluation of patients at risk for COVID-19 are in a state of rapid change based on information released by regulatory bodies including the CDC and federal and state organizations. These policies and algorithms were followed during the patient's care in the ED.  ____________________________________________   LABS (pertinent positives/negatives)  Labs Reviewed  CBC WITH DIFFERENTIAL/PLATELET - Abnormal; Notable for the following components:      Result Value   WBC 13.2 (*)    Hemoglobin 11.8 (*)    HCT 35.4 (*)    Neutro Abs 11.3 (*)    Abs Immature Granulocytes 0.11 (*)    All other components within normal limits  BASIC METABOLIC PANEL - Abnormal; Notable for the following components:   Potassium 2.6 (*)    CO2 19 (*)    Glucose, Bld 106 (*)    BUN <5 (*)    Calcium 8.1 (*)    All other components within normal limits  HCG, QUANTITATIVE, PREGNANCY - Abnormal; Notable for the following components:   hCG, Beta Chain, Quant, S 33,616 (*)    All other components within normal limits  BRAIN NATRIURETIC PEPTIDE    RADIOLOGY Fetal heart tones were normal    IMPRESSION:  Streaky perihilar and bibasilar airspace opacities suspicious for  multifocal viral/atypical pneumonia in the setting of known viral  illness.  ____________________________________________   DIFFERENTIAL DIAGNOSIS   COVID-19, PE, pneumonia, anemia  FINAL ASSESSMENT AND PLAN  COVID-19, third trimester pregnancy, hypokalemia   Plan: The patient had presented for symptoms of COVID-19. Patient's labs did reveal hypokalemia with a potassium of 2.6.  She was given IV fluids containing IV potassium.  Patient's imaging revealed a typical Covid pattern.  Fetal heart tones were unremarkable.  She is advised to return for worsening or worrisome symptoms.   Laurence Aly,  MD    Note: This note was generated in part or whole with voice recognition software. Voice recognition is usually quite accurate but there are transcription errors that can and very often do occur. I apologize for any typographical errors that were not detected and corrected.     Earleen Newport, MD 11/29/19 [phone]

## 2019-11-29 NOTE — ED Notes (Addendum)
Date and time results received: 11/29/19 1:34 PM (use smartphrase ".now" to insert current time)  Test: potassium Critical Value: 2.6 Name of Provider Notified: Dr. Lenise Arena  Orders Received? Or Actions Taken?:

## 2019-11-29 NOTE — ED Notes (Signed)
ED Provider at bedside. 

## 2019-11-29 NOTE — ED Notes (Signed)
Patient ambulated to and from room commode with a steady gait. NAD. Patient states she has felt the baby move. IV fluids are infusing without difficulty. IV site is patent.

## 2019-11-29 NOTE — Discharge Instructions (Addendum)
Please seek medical attention for any high fevers, chest pain, shortness of breath, change in behavior, persistent vomiting, bloody stool or any other new or concerning symptoms.  

## 2019-11-29 NOTE — ED Notes (Signed)
First Nurse Note: Pt to ED via POV. Pt states that she is COVID +. Pt is also 8 months pregnant. Pt c/o shortness of breath and fever.

## 2019-11-29 NOTE — ED Notes (Signed)
Ambulates around room independently with RN monitoring. Hr jumps to 145 with ambulation, oxygen 95-97%.

## 2019-11-29 NOTE — ED Triage Notes (Signed)
Pt is 8 month pregnant.  Dx with covic 12/27 and here for fever, cough, SHOB, and joint pain.  Last tylenol at 4 AM.  Labored in triage, tachypnea noted.

## 2019-11-29 NOTE — ED Notes (Signed)
Pt up to bathroom.

## 2019-11-30 ENCOUNTER — Other Ambulatory Visit: Payer: Self-pay

## 2019-11-30 ENCOUNTER — Emergency Department: Payer: PRIVATE HEALTH INSURANCE

## 2019-11-30 ENCOUNTER — Encounter: Payer: Self-pay | Admitting: Emergency Medicine

## 2019-11-30 ENCOUNTER — Inpatient Hospital Stay
Admission: EM | Admit: 2019-11-30 | Discharge: 2019-12-02 | DRG: 831 | Disposition: A | Discharge status: Other Acute Inpatient Hospital | Payer: PRIVATE HEALTH INSURANCE | Attending: Internal Medicine | Admitting: Internal Medicine

## 2019-11-30 DIAGNOSIS — Z3A33 33 weeks gestation of pregnancy: Secondary | ICD-10-CM

## 2019-11-30 DIAGNOSIS — E860 Dehydration: Secondary | ICD-10-CM

## 2019-11-30 DIAGNOSIS — J1282 Pneumonia due to coronavirus disease 2019: Secondary | ICD-10-CM | POA: Diagnosis not present

## 2019-11-30 DIAGNOSIS — Z3493 Encounter for supervision of normal pregnancy, unspecified, third trimester: Secondary | ICD-10-CM

## 2019-11-30 DIAGNOSIS — O99283 Endocrine, nutritional and metabolic diseases complicating pregnancy, third trimester: Secondary | ICD-10-CM | POA: Diagnosis present

## 2019-11-30 DIAGNOSIS — J189 Pneumonia, unspecified organism: Secondary | ICD-10-CM | POA: Diagnosis not present

## 2019-11-30 DIAGNOSIS — D72829 Elevated white blood cell count, unspecified: Secondary | ICD-10-CM | POA: Diagnosis not present

## 2019-11-30 DIAGNOSIS — U071 COVID-19: Secondary | ICD-10-CM

## 2019-11-30 DIAGNOSIS — O99513 Diseases of the respiratory system complicating pregnancy, third trimester: Secondary | ICD-10-CM | POA: Diagnosis present

## 2019-11-30 DIAGNOSIS — J9601 Acute respiratory failure with hypoxia: Secondary | ICD-10-CM | POA: Diagnosis not present

## 2019-11-30 DIAGNOSIS — R000 Tachycardia, unspecified: Secondary | ICD-10-CM | POA: Diagnosis present

## 2019-11-30 DIAGNOSIS — O98513 Other viral diseases complicating pregnancy, third trimester: Principal | ICD-10-CM | POA: Diagnosis present

## 2019-11-30 DIAGNOSIS — E876 Hypokalemia: Secondary | ICD-10-CM | POA: Diagnosis present

## 2019-11-30 LAB — TRIGLYCERIDES: Triglycerides: 317 mg/dL — ABNORMAL HIGH (ref <150)

## 2019-11-30 LAB — CBC WITH DIFFERENTIAL/PLATELET
Abs Immature Granulocytes: 0.34 K/uL — ABNORMAL HIGH (ref 0.00–0.07)
Basophils Absolute: 0 K/uL (ref 0.0–0.1)
Basophils Relative: 0 %
Eosinophils Absolute: 0 K/uL (ref 0.0–0.5)
Eosinophils Relative: 0 %
HCT: 34.9 % — ABNORMAL LOW (ref 36.0–46.0)
Hemoglobin: 11.5 g/dL — ABNORMAL LOW (ref 12.0–15.0)
Immature Granulocytes: 2 %
Lymphocytes Relative: 5 %
Lymphs Abs: 1 K/uL (ref 0.7–4.0)
MCH: 28.8 pg (ref 26.0–34.0)
MCHC: 33 g/dL (ref 30.0–36.0)
MCV: 87.5 fL (ref 80.0–100.0)
Monocytes Absolute: 0.4 K/uL (ref 0.1–1.0)
Monocytes Relative: 2 %
Neutro Abs: 20.4 K/uL — ABNORMAL HIGH (ref 1.7–7.7)
Neutrophils Relative %: 91 %
Platelets: 249 K/uL (ref 150–400)
RBC: 3.99 MIL/uL (ref 3.87–5.11)
RDW: 12.6 % (ref 11.5–15.5)
WBC: 22.2 K/uL — ABNORMAL HIGH (ref 4.0–10.5)
nRBC: 0 % (ref 0.0–0.2)

## 2019-11-30 LAB — CBC
HCT: 32.9 % — ABNORMAL LOW (ref 36.0–46.0)
Hemoglobin: 11.6 g/dL — ABNORMAL LOW (ref 12.0–15.0)
MCH: 29.1 pg (ref 26.0–34.0)
MCHC: 35.3 g/dL (ref 30.0–36.0)
MCV: 82.5 fL (ref 80.0–100.0)
Platelets: 225 10*3/uL (ref 150–400)
RBC: 3.99 MIL/uL (ref 3.87–5.11)
RDW: 12.6 % (ref 11.5–15.5)
WBC: 21.8 10*3/uL — ABNORMAL HIGH (ref 4.0–10.5)
nRBC: 0 % (ref 0.0–0.2)

## 2019-11-30 LAB — BASIC METABOLIC PANEL
Anion gap: 12 (ref 5–15)
BUN: <5 mg/dL — ABNORMAL LOW (ref 6–20)
CO2: 19 mmol/L — ABNORMAL LOW (ref 22–32)
Calcium: 8.6 mg/dL — ABNORMAL LOW (ref 8.9–10.3)
Chloride: 108 mmol/L (ref 98–111)
Creatinine, Ser: 0.47 mg/dL (ref 0.44–1.00)
GFR calc Af Amer: >60 mL/min (ref >60)
GFR calc non Af Amer: >60 mL/min (ref >60)
Glucose, Bld: 121 mg/dL — ABNORMAL HIGH (ref 70–99)
Potassium: 2.8 mmol/L — ABNORMAL LOW (ref 3.5–5.1)
Sodium: 139 mmol/L (ref 135–145)

## 2019-11-30 LAB — HEPATIC FUNCTION PANEL
ALT: 18 U/L (ref 0–44)
AST: 33 U/L (ref 15–41)
Albumin: 3.2 g/dL — ABNORMAL LOW (ref 3.5–5.0)
Alkaline Phosphatase: 109 U/L (ref 38–126)
Bilirubin, Direct: 0.2 mg/dL (ref 0.0–0.2)
Indirect Bilirubin: 0.5 mg/dL (ref 0.3–0.9)
Total Bilirubin: 0.7 mg/dL (ref 0.3–1.2)
Total Protein: 6.9 g/dL (ref 6.5–8.1)

## 2019-11-30 LAB — FIBRINOGEN: Fibrinogen: 485 mg/dL — ABNORMAL HIGH (ref 210–475)

## 2019-11-30 LAB — ABO/RH: ABO/RH(D): B POS

## 2019-11-30 LAB — POC SARS CORONAVIRUS 2 AG: SARS Coronavirus 2 Ag: NEGATIVE

## 2019-11-30 LAB — PROCALCITONIN: Procalcitonin: 0.92 ng/mL

## 2019-11-30 LAB — FERRITIN: Ferritin: 138 ng/mL (ref 11–307)

## 2019-11-30 LAB — LACTATE DEHYDROGENASE: LDH: 198 U/L — ABNORMAL HIGH (ref 98–192)

## 2019-11-30 LAB — LACTIC ACID, PLASMA: Lactic Acid, Venous: 1.7 mmol/L (ref 0.5–1.9)

## 2019-11-30 MED ORDER — DEXAMETHASONE SODIUM PHOSPHATE 10 MG/ML IJ SOLN
10.0000 mg | Freq: Once | INTRAMUSCULAR | Status: AC
  Administered 2019-11-30: 21:00:00 10 mg via INTRAVENOUS
  Filled 2019-11-30: qty 1

## 2019-11-30 MED ORDER — ACETAMINOPHEN 325 MG PO TABS
650.0000 mg | ORAL_TABLET | Freq: Once | ORAL | Status: AC
  Administered 2019-11-30: 650 mg via ORAL
  Filled 2019-11-30: qty 2

## 2019-11-30 MED ORDER — ASCORBIC ACID 500 MG PO TABS
500.0000 mg | ORAL_TABLET | Freq: Every day | ORAL | Status: DC
  Administered 2019-11-30 – 2019-12-01 (×2): 500 mg via ORAL
  Filled 2019-11-30 (×2): qty 1

## 2019-11-30 MED ORDER — SODIUM CHLORIDE 0.9 % IV SOLN
100.0000 mg | Freq: Every day | INTRAVENOUS | Status: DC
  Filled 2019-11-30: qty 20

## 2019-11-30 MED ORDER — POLYETHYLENE GLYCOL 3350 17 G PO PACK: 17.0000 g | PACK | Freq: Every day | ORAL | Status: DC | PRN

## 2019-11-30 MED ORDER — ONDANSETRON HCL 4 MG/2ML IJ SOLN
4.0000 mg | Freq: Four times a day (QID) | INTRAMUSCULAR | Status: DC | PRN
  Administered 2019-12-01: 12:00:00 4 mg via INTRAVENOUS
  Filled 2019-11-30: qty 2

## 2019-11-30 MED ORDER — ZINC SULFATE 220 (50 ZN) MG PO CAPS
220.0000 mg | ORAL_CAPSULE | Freq: Every day | ORAL | Status: DC
  Administered 2019-11-30 – 2019-12-01 (×2): 220 mg via ORAL
  Filled 2019-11-30 (×2): qty 1

## 2019-11-30 MED ORDER — POTASSIUM CHLORIDE 2 MEQ/ML IV SOLN
INTRAVENOUS | Status: DC
  Filled 2019-11-30 (×8): qty 1000

## 2019-11-30 MED ORDER — SODIUM CHLORIDE 0.9% FLUSH: 3.0000 mL | Freq: Two times a day (BID) | INTRAVENOUS | Status: DC

## 2019-11-30 MED ORDER — HYDROCOD POLST-CPM POLST ER 10-8 MG/5ML PO SUER
5.0000 mL | Freq: Two times a day (BID) | ORAL | Status: DC | PRN
  Administered 2019-12-01: 5 mL via ORAL
  Filled 2019-11-30 (×2): qty 5

## 2019-11-30 MED ORDER — ACETAMINOPHEN 650 MG RE SUPP: 650.0000 mg | Freq: Four times a day (QID) | RECTAL | Status: DC | PRN

## 2019-11-30 MED ORDER — HEPARIN SODIUM (PORCINE) 5000 UNIT/ML IJ SOLN
5000.0000 [IU] | Freq: Three times a day (TID) | INTRAMUSCULAR | Status: DC
  Administered 2019-12-01 (×3): 5000 [IU] via SUBCUTANEOUS
  Filled 2019-11-30 (×4): qty 1

## 2019-11-30 MED ORDER — GUAIFENESIN-DM 100-10 MG/5ML PO SYRP
10.0000 mL | ORAL_SOLUTION | ORAL | Status: DC | PRN
  Administered 2019-11-30: 23:00:00 10 mL via ORAL
  Filled 2019-11-30 (×2): qty 10

## 2019-11-30 MED ORDER — SODIUM CHLORIDE 0.9 % IV SOLN
200.0000 mg | Freq: Once | INTRAVENOUS | Status: DC
  Filled 2019-11-30: qty 40

## 2019-11-30 MED ORDER — POTASSIUM CHLORIDE 20 MEQ PO PACK
40.0000 meq | PACK | Freq: Once | ORAL | Status: AC
  Administered 2019-11-30: 40 meq via ORAL
  Filled 2019-11-30: qty 2

## 2019-11-30 MED ORDER — DEXAMETHASONE SODIUM PHOSPHATE 10 MG/ML IJ SOLN
6.0000 mg | INTRAMUSCULAR | Status: DC
  Administered 2019-12-01: 21:00:00 6 mg via INTRAVENOUS
  Filled 2019-11-30: qty 1

## 2019-11-30 MED ORDER — PRENATAL MULTIVITAMIN CH
1.0000 | ORAL_TABLET | Freq: Every day | ORAL | Status: DC
  Administered 2019-12-01: 13:00:00 1 via ORAL
  Filled 2019-11-30 (×2): qty 1

## 2019-11-30 MED ORDER — SODIUM CHLORIDE 0.9 % IV BOLUS
1000.0000 mL | Freq: Once | INTRAVENOUS | Status: AC
  Administered 2019-11-30: 21:00:00 1000 mL via INTRAVENOUS

## 2019-11-30 MED ORDER — ONDANSETRON HCL 4 MG PO TABS: 4.0000 mg | ORAL_TABLET | Freq: Four times a day (QID) | ORAL | Status: DC | PRN

## 2019-11-30 MED ORDER — ACETAMINOPHEN 325 MG PO TABS: 650.0000 mg | ORAL_TABLET | Freq: Four times a day (QID) | ORAL | Status: DC | PRN

## 2019-11-30 MED ORDER — HYDROCODONE-ACETAMINOPHEN 5-325 MG PO TABS
1.0000 | ORAL_TABLET | ORAL | Status: DC | PRN
  Administered 2019-12-01 (×2): 1 via ORAL
  Filled 2019-11-30 (×2): qty 1

## 2019-11-30 NOTE — ED Notes (Addendum)
FHR 136. Good fetal movement per mother.

## 2019-11-30 NOTE — ED Triage Notes (Signed)
Pt to ED via POV c/o shortness of breath. Pt states that she was seen yesterday and given steroids. Pt COVID +. Pt also [redacted] weeks pregnant. Pt SpO2 91%, tachy in 131's.

## 2019-11-30 NOTE — Progress Notes (Signed)
Remdesivir - Pharmacy Brief Note   O:  ALT:  CXR:  SpO2: % on    A/P:  Remdesivir 200 mg IVPB once followed by 100 mg IVPB daily x 4 days.   .me 11/30/2019 8:49 PM

## 2019-11-30 NOTE — H&P (Signed)
History and Physical    Heather Horn [id number] DOB: May 01, 1986 DOA: 11/30/2019   PCP: [place]  Outpatient Specialists:  Patient coming from: Home  Chief Complaint: Shortness of breath  HPI: Heather Horn is a 34 y.o. female with medical history significant for being [redacted] weeks pregnant who presents to the hospital for shortness of breath.  Patient states that she began having shortness of breath over the last few days and presented to the ED yesterday.  She was diagnosed with Covid and was discharged home with steroids at that time.  She returns now stating that her shortness of breath is worsened significantly.  She describes her shortness of breath as severe and now has associated poor oral intake.  She reports that her oxygen saturation dropped down to 87% and so she presented back to the emergency department.  She states that she has an associated cough that seems to worsen her shortness of breath as well as her generalized discomfort.  Her shortness of breath is also worse with movement.  She has not been unable to find any remitting factors.  She denies any significant abdominal pain although she does admit to suprapubic discomfort upon coughing.  She denies any vaginal bleeding or leakage.  She denies any current chest pain, dysuria, or headache.  She does admit to being around her husband who was diagnosed with Covid previously.   ED Course: Upon arrival to the emergency department the patient was placed on supplemental O2.  She then underwent a chest x-ray revealing worsening bilateral airspace disease compatible with multifocal pneumonia.  She was given IV Decadron and started on remdesivir as well.  She will be admitted to [facility] with inpatient status for hypoxic respiratory failure secondary to Covid pneumonia in pregnancy.  Review of Systems: As per HPI otherwise 10 point review of systems negative.    Past Medical History:  Diagnosis Date  . Gestational  hypertension   . Phyllodes tumor of breast 2019    Past Surgical History:  Procedure Laterality Date  . MASTECTOMY, PARTIAL Right 09/01/2018     reports that she has never smoked. She has never used smokeless tobacco. She reports previous alcohol use. She reports that she does not use drugs.  No Known Allergies  Family History  Problem Relation Age of Onset  . Hypertension Mother   . Hypertension Father     Prior to Admission medications   Medication Sig Start Date End Date Taking? Authorizing Provider  acetaminophen (TYLENOL) 325 MG tablet Take 650 mg by mouth every 6 (six) hours as needed.   Yes [provider]  methylPREDNISolone (MEDROL) 4 MG TBPK tablet Dispense Medrol Dosepak as directed 11/29/19  Yes Earleen Newport, MD  Prenatal Vit-Fe Fumarate-FA (MULTIVITAMIN-PRENATAL) 27-0.8 MG TABS tablet Take 1 tablet by mouth daily at 12 noon.   Yes [provider]    Physical Exam: Vitals:   11/30/19 2122 11/30/19 2230 11/30/19 2244 11/30/19 2300  BP:  122/62  123/64  Pulse: (!) 116 (!) 113  (!) 118  Resp: 17   (!) 32  Temp: (!) 101.1 F (38.4 C)  98 F (36.7 C)   TempSrc: Oral  Oral   SpO2: 98% 93%  92%      Constitutional: NAD, calm, uncomfortable Vitals:   11/30/19 2122 11/30/19 2230 11/30/19 2244 11/30/19 2300  BP:  122/62  123/64  Pulse: (!) 116 (!) 113  (!) 118  Resp: 17   (!) 32  Temp: Marland Kitchen)  101.1 F (38.4 C)  98 F (36.7 C)   TempSrc: Oral  Oral   SpO2: 98% 93%  92%   Eyes: PERRL, lids and conjunctivae normal Neck: normal, supple, no masses, no thyromegaly Respiratory: Diffuse crackles on auscultation bilaterally, no wheezing. Normal respiratory effort. No accessory muscle use.  On supplemental O2. Cardiovascular: Tachycardic.  Sinus rhythm, no murmurs / rubs / gallops. No extremity edema. 2+ pedal pulses. No carotid bruits.  Abdomen: no tenderness, no masses palpated. No hepatosplenomegaly. Bowel sounds positive.  Pregnant  abdomen Musculoskeletal: no clubbing / cyanosis. No joint deformity upper and lower extremities. Good ROM, no contractures. Normal muscle tone.  Skin: no rashes, lesions, ulcers. No induration Neurologic: CN 2-12 grossly intact. Sensation intact. Strength 5/5 in all 4.  Psychiatric: Normal judgment and insight. Alert and oriented x 3. Normal mood.   Labs on Admission: I have personally reviewed following labs and imaging studies  CBC: Recent Labs  Lab 11/29/19 1249 11/30/19 1829  WBC 13.2* 22.2*  21.8*  NEUTROABS 11.3* 20.4*  HGB 11.8* 11.5*  11.6*  HCT 35.4* 34.9*  32.9*  MCV 86.1 87.5  82.5  PLT 220 249  [id number]   Basic Metabolic Panel: Recent Labs  Lab 11/29/19 1249 11/30/19 1829  NA 135 139  K 2.6* 2.8*  CL 104 108  CO2 19* 19*  GLUCOSE 106* 121*  BUN <5* <5*  CREATININE 0.50 0.47  CALCIUM 8.1* 8.6*   GFR: Estimated Creatinine Clearance: 94.8 mL/min (by C-G formula based on SCr of 0.47 mg/dL). Liver Function Tests: Recent Labs  Lab 11/30/19 2024  AST 33  ALT 18  ALKPHOS 109  BILITOT 0.7  PROT 6.9  ALBUMIN 3.2*   No results for input(s): LIPASE, AMYLASE in the last 168 hours. No results for input(s): AMMONIA in the last 168 hours. Coagulation Profile: No results for input(s): INR, PROTIME in the last 168 hours. Cardiac Enzymes: No results for input(s): CKTOTAL, CKMB, CKMBINDEX, TROPONINI in the last 168 hours. BNP (last 3 results) No results for input(s): PROBNP in the last 8760 hours. HbA1C: No results for input(s): HGBA1C in the last 72 hours. CBG: No results for input(s): GLUCAP in the last 168 hours. Lipid Profile: Recent Labs    11/30/19 2024  TRIG 317*   Thyroid Function Tests: No results for input(s): TSH, T4TOTAL, FREET4, T3FREE, THYROIDAB in the last 72 hours. Anemia Panel: Recent Labs    11/30/19 2024  FERRITIN 138   Urine analysis:    Component Value Date/Time   GLUCOSEU Negative 09/16/2019 0907   Sepsis  Labs: @LABRCNTIP (procalcitonin:4,lacticidven:4) ) Recent Results (from the past 240 hour(s))  Respiratory Panel by RT PCR (Flu A&B, Covid) - Nasopharyngeal Swab     Status: Abnormal   Collection Time: 11/30/19 10:16 PM   Specimen: Nasopharyngeal Swab  Result Value Ref Range Status   SARS Coronavirus 2 by RT PCR POSITIVE (A) NEGATIVE Final    Comment: RESULT CALLED TO, READ BACK BY AND VERIFIED WITH: YESSICA AGUAS 11/29/2018 AT 2323 HS    Influenza A by PCR NEGATIVE NEGATIVE Final   Influenza B by PCR NEGATIVE NEGATIVE Final    Comment: (NOTE) The Xpert Xpress SARS-CoV-2/FLU/RSV assay is intended as an aid in  the diagnosis of influenza from Nasopharyngeal swab specimens and  should not be used as a sole basis for treatment. Nasal washings and  aspirates are unacceptable for Xpert Xpress SARS-CoV-2/FLU/RSV  testing. Fact Sheet for Patients: PinkCheek.be Fact Sheet for Healthcare Providers: GravelBags.it This test  is not yet approved or cleared by the [place] and  has been authorized for detection and/or diagnosis of SARS-CoV-2 by  FDA under an Emergency Use Authorization (EUA). This EUA will remain  in effect (meaning this test can be used) for the duration of the  Covid-19 declaration under Section 564(b)(1) of the Act, 21  U.S.C. section 360bbb-3(b)(1), unless the authorization is  terminated or revoked. Performed at [facility], [address] [place], [place] [address]      Radiological Exams on Admission: DG Chest [facility] 1 View  Result Date: 11/30/2019 CLINICAL DATA:  Shortness of breath, fever, [redacted] weeks pregnant COVID positive EXAM: PORTABLE CHEST 1 VIEW COMPARISON:  11/29/2019 FINDINGS: Multifocal bilateral ground-glass opacities and vague consolidations slightly worse in the upper lobes and right base. Stable cardiomediastinal silhouette. Possible small left effusion. No pneumothorax. IMPRESSION:  1. Slight interval worsening of bilateral airspace disease compatible with multifocal pneumonia. 2. Probable small left-sided pleural effusion Electronically Signed   By: Donavan Foil M.D.   On: 11/30/2019 20:48   DG Chest Port 1 View  Result Date: 11/29/2019 CLINICAL DATA:  Cough, fever, COVID-19 positive EXAM: PORTABLE CHEST 1 VIEW COMPARISON:  None. FINDINGS: The heart size and mediastinal contours are within normal limits. Streaky perihilar and bibasilar airspace opacities. No pleural effusion or pneumothorax. The visualized skeletal structures are unremarkable. IMPRESSION: Streaky perihilar and bibasilar airspace opacities suspicious for multifocal viral/atypical pneumonia in the setting of known viral illness. Electronically Signed   By: Davina Poke D.O.   On: 11/29/2019 13:25    EKG: Independently reviewed.  Sinus tachycardia, otherwise unremarkable.  Assessment/Plan Principal Problem:   Pneumonia Active Problems:   Pregnant and not yet delivered in third trimester   COVID-19 virus infection   Acute respiratory failure with hypoxia (HCC)   Tachycardia   Leukocytosis   #COVID-19 pneumonia -Providing 6 mg of IV Decadron with plan to continue for 10 days. -We will start remdesivir today is day 1 and will continue for 5 days as well.  Pharmacy to assist with dosing. -We will repeat ferritin, CRP, and D-dimer in the a.m. to monitor. -Providing vitamin C and zinc as well. -DVT prophylaxis with heparin subcu. -We will provide IV fluid with LR at 125 mL/h and monitor fluid status closely.  #Acute respiratory failure with hypoxia -Continue supplemental O2. -Incentive spirometry every 2 hours while awake. -Secondary to COVID-19 pneumonia as above. -Continuing supportive care.  #Tachycardia -Secondary to infection above. -Providing IV fluid to avoid volume depletion given her poor oral intake.  125 mL/h of LR.  #Hypokalemia -Patient with significant hypokalemia with a potassium  of 2.8. -Providing potassium and IV fluid. -She also received 40 mEq of p.o. potassium at 9 PM.  Will provide another 40 mEq twice daily tomorrow. -Repeat BMP this evening and again in the morning. -We will also check magnesium level.  #Pregnant in the third trimester -Patient is [redacted] weeks pregnant. -I have spoken to OB/GYN who will also be following the patient.  Plan for daily nonstress test for monitoring of the baby.   DVT prophylaxis: Heparin subcu  code Status: Full Family Communication: None Disposition Plan: Anticipate discharge home to self-care likely within the next 3 to 4 days pending clinical improvement.   Consults called: OB/GYN, Dr. Malachy Mood Admission status: Inpatient   Arlan Organ MD [facility]  If 7PM-7AM, please contact night-coverage [email or web address]  12/01/2019, 12:13 AM

## 2019-11-30 NOTE — ED Notes (Signed)
Hospital at bedside at this time.

## 2019-11-30 NOTE — ED Notes (Signed)
This RN sent a message to pharmacy to send LR w/K+. Awaiting on medication to be sent by pharmacy.

## 2019-11-30 NOTE — ED Notes (Signed)
Pt denies vaginal bleeding. Denies N/V/D/dizziness at this time.

## 2019-11-30 NOTE — ED Notes (Signed)
Pt placed on 2 liter O2 via [place]

## 2019-11-30 NOTE — ED Provider Notes (Signed)
[facility] Emergency Department Provider Note  ____________________________________________  Time seen: Approximately 8:54 PM  I have reviewed the triage vital signs and the nursing notes.   HISTORY  Chief Complaint Shortness of Breath and COVID    HPI Heather Horn is a 34 y.o. female currently [redacted] weeks pregnant, who complains of worsening shortness of breath weakness malaise and decreased oral intake for the past 4 days, gradually worsening.  She was seen in the ED yesterday where she was noted to have hypokalemia which was replaced, she was given hydration and started on steroids and discharged home.  However, her last 24 hours since leaving the emergency department she is continue to worsen, unable to eat or drink anything, worsening shortness of breath.  She noticed on a home pulse oximeter that she was at 87% today and so she came back to the emergency department.  Denies chest pain.  Symptoms are constant and severe, worse with walking and movement, no alleviating factors.  Denies any vaginal bleeding or fluid leakage.  No contractions.  No trauma or falls.  Fetal heart rate noted to be 136.     Past Medical History:  Diagnosis Date  . Gestational hypertension   . Phyllodes tumor of breast 2019     Patient Active Problem List   Diagnosis Date Noted  . Supervision of other normal pregnancy, antepartum 05/23/2019  . 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 MASTECTOMY, PARTIAL Right 09/01/2018     Prior to Admission medications   Medication Sig Start Date End Date Taking? Authorizing Provider  acetaminophen (TYLENOL) 325 MG tablet Take 650 mg by mouth every 6 (six) hours as needed.    [provider]  methylPREDNISolone (MEDROL) 4 MG TBPK tablet Dispense Medrol Dosepak as directed 11/29/19   Earleen Newport, MD  Prenatal Vit-Fe Fumarate-FA  (MULTIVITAMIN-PRENATAL) 27-0.8 MG TABS tablet Take 1 tablet by mouth daily at 12 noon.    [provider]     Allergies Patient has no known allergies.   Family History  Problem Relation Age of Onset  . Hypertension Mother   . Hypertension Father     Social History Social History   Tobacco Use  . Smoking status: Never Smoker  . Smokeless tobacco: Never Used  Substance Use Topics  . Alcohol use: Not Currently  . Drug use: Never    Review of Systems  Constitutional:   Positive fever and chills.  ENT:   No sore throat. No rhinorrhea. Cardiovascular:   No chest pain or syncope. Respiratory: Positive shortness of breath and nonproductive cough. Gastrointestinal:   Negative for abdominal pain, positive vomiting and diarrhea Musculoskeletal:   Negative for focal pain or swelling All other systems reviewed and are negative except as documented above in ROS and HPI.  ____________________________________________   PHYSICAL EXAM:  VITAL SIGNS: ED Triage Vitals [11/30/19 1820]  Enc Vitals Group     BP 132/60     Pulse Rate (!) 132     Resp 18     Temp (!) 101.9 F (38.8 C)     Temp src      SpO2 91 %     Weight      Height      Head Circumference      Peak Flow      Pain Score 7     Pain Loc  Pain Edu?      Excl. in [place]?     Vital signs reviewed, nursing assessments reviewed.   Constitutional:   Alert and oriented. Non-toxic appearance. Eyes:   Conjunctivae are normal. EOMI. PERRL. ENT      Head:   Normocephalic and atraumatic.      Nose:   Wearing a mask.      Mouth/Throat:   Wearing a mask.      Neck:   No meningismus. Full ROM. Hematological/Lymphatic/Immunilogical:   No cervical lymphadenopathy. Cardiovascular:   Tachycardia heart rate 130. Symmetric bilateral radial and DP pulses.  No murmurs. Cap refill less than 2 seconds. Respiratory:   Normal respiratory effort without tachypnea/retractions.  Bilateral basilar  crackles Gastrointestinal:   Soft and nontender.  Gravid.  There is no CVA tenderness.  No rebound, rigidity, or guarding.  Musculoskeletal:   Normal range of motion in all extremities. No joint effusions.  No lower extremity tenderness.  No edema. Neurologic:   Normal speech and language.  Motor grossly intact. No acute focal neurologic deficits are appreciated.  Skin:    Skin is warm, dry and intact. No rash noted.  No petechiae, purpura, or bullae.  ____________________________________________    LABS (pertinent positives/negatives) (all labs ordered are listed, but only abnormal results are displayed) Labs Reviewed  CBC - Abnormal; Notable for the following components:      Result Value   WBC 21.8 (*)    Hemoglobin 11.6 (*)    HCT 32.9 (*)    All other components within normal limits  BASIC METABOLIC PANEL - Abnormal; Notable for the following components:   Potassium 2.8 (*)    CO2 19 (*)    Glucose, Bld 121 (*)    BUN <5 (*)    Calcium 8.6 (*)    All other components within normal limits  CULTURE, BLOOD (ROUTINE X 2)  CULTURE, BLOOD (ROUTINE X 2)  LACTIC ACID, PLASMA  LACTIC ACID, PLASMA  FIBRIN DERIVATIVES D-DIMER ([facility] ONLY)  PROCALCITONIN  LACTATE DEHYDROGENASE  FERRITIN  TRIGLYCERIDES  FIBRINOGEN  C-REACTIVE PROTEIN  HEPATIC FUNCTION PANEL  DIFFERENTIAL  POC SARS CORONAVIRUS 2 AG -  ED   ____________________________________________   EKG Interpreted by me Sinus tachycardia rate 126.  Normal axis intervals QRS ST segments and T waves.   ____________________________________________    [id number]  DG Chest Port 1 View  Result Date: 11/30/2019 CLINICAL DATA:  Shortness of breath, fever, [redacted] weeks pregnant COVID positive EXAM: PORTABLE CHEST 1 VIEW COMPARISON:  11/29/2019 FINDINGS: Multifocal bilateral ground-glass opacities and vague consolidations slightly worse in the upper lobes and right base. Stable cardiomediastinal silhouette. Possible small left  effusion. No pneumothorax. IMPRESSION: 1. Slight interval worsening of bilateral airspace disease compatible with multifocal pneumonia. 2. Probable small left-sided pleural effusion Electronically Signed   By: Donavan Foil M.D.   On: 11/30/2019 20:48    ____________________________________________   PROCEDURES .Critical Care Performed by: Carrie Mew, MD Authorized by: Carrie Mew, MD   Critical care provider statement:    Critical care time (minutes):  35   Critical care time was exclusive of:  Separately billable procedures and treating other patients   Critical care was necessary to treat or prevent imminent or life-threatening deterioration of the following conditions:  Respiratory failure   Critical care was time spent personally by me on the following activities:  Development of treatment plan with patient or surrogate, discussions with consultants, evaluation of patient's response to treatment, examination of patient, obtaining  history from patient or surrogate, ordering and performing treatments and interventions, ordering and review of laboratory studies, ordering and review of radiographic studies, pulse oximetry, re-evaluation of patient's condition and review of old charts    ____________________________________________    CLINICAL IMPRESSION / ASSESSMENT AND PLAN / ED COURSE  Medications ordered in the ED: Medications  dexamethasone (DECADRON) injection 10 mg (has no administration in time range)  potassium chloride (KLOR-CON) packet 40 mEq (has no administration in time range)  remdesivir 200 mg in sodium chloride 0.9% 250 mL IVPB (has no administration in time range)    Followed by  remdesivir 100 mg in sodium chloride 0.9 % 100 mL IVPB (has no administration in time range)  acetaminophen (TYLENOL) tablet 650 mg (has no administration in time range)  sodium chloride 0.9 % bolus 1,000 mL (1,000 mLs Intravenous New Bag/Given 11/30/19 2053)    Pertinent labs  & imaging results that were available during my care of the patient were reviewed by me and considered in my medical decision making (see chart for details).  Alyssha Guyett was evaluated in Emergency Department on 11/30/2019 for the symptoms described in the history of present illness. She was evaluated in the context of the global COVID-19 pandemic, which necessitated consideration that the patient might be at risk for infection with the SARS-CoV-2 virus that causes COVID-19. Institutional protocols and algorithms that pertain to the evaluation of patients at risk for COVID-19 are in a state of rapid change based on information released by regulatory bodies including the CDC and federal and state organizations. These policies and algorithms were followed during the patient's care in the ED.     Clinical Course as of Nov 29 2052  Sat Nov 30, 2019  2033 Patient presents with worsening shortness of breath, frequent nonproductive cough.  Fever tachycardia, reports an oxygen saturation of 87% at home.  Oxygenation 96% on 2 L nasal cannula.  She has a leukocytosis of 22,000 which I think is due to recent initiation of steroids given her known COVID-19 infection diagnosis which explains her symptoms.  With worsening shortness of breath, third trimester pregnancy, will plan to hospitalize for further management.  I do not think she is septic.   [PS]    Clinical Course User Index [PS] Carrie Mew, MD    ----------------------------------------- 8:58 PM on 11/30/2019 -----------------------------------------  Decadron, remdesivir, oral potassium, oral Tylenol ordered.  IV fluids for hydration.  Doubt bacterial infection at this time, will defer antibiotics pending further work-up.  ----------------------------------------- 9:30 PM on 11/30/2019 -----------------------------------------  Chest x-ray confirms worsening bilateral pulmonary infiltrates consistent with COVID-19  pneumonia.   ____________________________________________   FINAL CLINICAL IMPRESSION(S) / ED DIAGNOSES    Final diagnoses:  Pneumonia due to COVID-19 virus  Third trimester pregnancy  Hypokalemia  Dehydration     ED Discharge Orders    None      Portions of this note were generated with dragon dictation software. Dictation errors may occur despite best attempts at proofreading.   Carrie Mew, MD 11/30/19 2130

## 2019-11-30 NOTE — Consult Note (Signed)
Obstetrics & Gynecology Consult H&P   Consulting Department: Emergency Department  Consulting Physician: Carrie Mew, MD  Consulting Question: Covid Pneumonia   History of Present Illness: Patient is a 34 y.o. G2P1001 at [redacted]w[redacted]d by Estimated Date of Delivery: 01/15/20 who has received regular prenatal care this pregnancy at [facility] with no complications other than recent diagnosis of COVID last week.  Her husband also tested positive.   She reports increasing shortness of breath and decreased O2 saturations into the upper 80's on home monitoring with ambulation.  She has also noted worsening cough, with episodes of cough induced emesis.  She feels weak and fatigued.  This is a representation after being evaluated yesterday and started on po steroids.  She continues to have fevers at home.   The patient reports good fetal movement, denies contractions, LOF, or vaginal bleeding.  Pregnancy #2 Problems (from 03/04/19 to present)    Problem Noted Resolved   Pregnant and not yet delivered in third trimester 05/23/2019 by Rod Can, CNM No   Overview Addendum 10/14/2019  9:48 AM by Homero Fellers, [place]  Dating 7wk5d [place] Blood type: B/Positive/-- (07/06 0958)   Genetic Screen NIPS:diploid XX Antibody:Negative (07/06 0958)  Anatomic [place] Normal 9/28 Rubella: 9.67 (07/06 0958) Varicella:Immune  GTT Third trimester: 95 RPR: Non Reactive (07/06 0958)   Rhogam  not needed HBsAg: Negative (07/06 0958)   TDaP vaccine 10/14/2019                     Flu Shot: Declines HIV: Non Reactive (07/06 0958)   Baby Food                                GBS:   Contraception  Pap: hx LSIL 2019 neg HR HPV  CBB  No   CS/VBAC NA Partial R mastectomy-borderline phyllodes tumor 2018  Support Person Lennette Bihari              Review of Systems:10 point review of systems  Past Medical History:  Past Medical History:  Diagnosis Date  . Gestational hypertension   .  Phyllodes tumor of breast 2019    Past Surgical History:  Past Surgical History:  Procedure Laterality Date  . MASTECTOMY, PARTIAL Right 09/01/2018    Gynecologic History:   Obstetric History: G2P1001  Family History:  Family History  Problem Relation Age of Onset  . Hypertension Mother   . Hypertension Father     Social History:  Social History   Socioeconomic History  . Marital status: Married    Spouse name: Not on file  . Number of children: Not on file  . Years of education: Not on file  . Highest education level: Not on file  Occupational History  . Not on file  Tobacco Use  . Smoking status: Never Smoker  . Smokeless tobacco: Never Used  Substance and Sexual Activity  . Alcohol use: Not Currently  . Drug use: Never  . Sexual activity: Yes    Birth control/protection: None  Other Topics Concern  . Not on file  Social History Narrative  . Not on file   Social Determinants of Health   Financial Resource Strain:   . Difficulty of Paying Living Expenses: Not on file  Food Insecurity:   . Worried About Charity fundraiser in the Last Year: Not on file  . Ran  Out of Food in the Last Year: Not on file  Transportation Needs:   . Lack of Transportation (Medical): Not on file  . Lack of Transportation (Non-Medical): Not on file  Physical Activity:   . Days of Exercise per Week: Not on file  . Minutes of Exercise per Session: Not on file  Stress:   . Feeling of Stress : Not on file  Social Connections:   . Frequency of Communication with Friends and Family: Not on file  . Frequency of Social Gatherings with Friends and Family: Not on file  . Attends Religious Services: Not on file  . Active Member of Clubs or Organizations: Not on file  . Attends Archivist Meetings: Not on file  . Marital Status: Not on file  Intimate Partner Violence:   . Fear of Current or Ex-Partner: Not on file  . Emotionally Abused: Not on file  . Physically Abused: Not  on file  . Sexually Abused: Not on file    Allergies:  No Known Allergies  Medications: Prior to Admission medications   Medication Sig Start Date End Date Taking? Authorizing Provider  acetaminophen (TYLENOL) 325 MG tablet Take 650 mg by mouth every 6 (six) hours as needed.   Yes [provider]  methylPREDNISolone (MEDROL) 4 MG TBPK tablet Dispense Medrol Dosepak as directed 11/29/19  Yes Earleen Newport, MD  Prenatal Vit-Fe Fumarate-FA (MULTIVITAMIN-PRENATAL) 27-0.8 MG TABS tablet Take 1 tablet by mouth daily at 12 noon.   Yes [provider]    Physical Exam Vitals: Blood pressure 127/66, pulse (!) 116, temperature (!) 101.1 F (38.4 C), temperature source Oral, resp. rate 17, last menstrual period 03/04/2019, SpO2 98 %. General: NAD HEENT: normocephalic, anicteric Pulmonary: mildly increased work of breathing, able to talk in complete sentences Abdomen: gravid, soft, non-tender Extremities: no edema, erythema, or tenderness Neurologic: Grossly intact Psychiatric: mood appropriate, affect full  Labs: Results for orders placed or performed during the hospital encounter of 11/30/19 (from the past 72 hour(s))  CBC     Status: Abnormal   Collection Time: 11/30/19  6:29 PM  Result Value Ref Range   WBC 21.8 (H) 4.0 - 10.5 K/uL   RBC 3.99 3.87 - 5.11 MIL/uL   Hemoglobin 11.6 (L) 12.0 - 15.0 g/dL   HCT 32.9 (L) 36.0 - 46.0 %   MCV 82.5 80.0 - 100.0 fL   MCH 29.1 26.0 - 34.0 pg   MCHC 35.3 30.0 - 36.0 g/dL   RDW 12.6 11.5 - 15.5 %   Platelets 225 150 - 400 K/uL   nRBC 0.0 0.0 - 0.2 %    Comment: Performed at [facility], [address] [place], [place] [id number]  Basic metabolic panel     Status: Abnormal   Collection Time: 11/30/19  6:29 PM  Result Value Ref Range   Sodium 139 135 - 145 mmol/L   Potassium 2.8 (L) 3.5 - 5.1 mmol/L   Chloride 108 98 - 111 mmol/L   CO2 19 (L) 22 - 32 mmol/L   Glucose, Bld 121 (H) 70 - 99 mg/dL   BUN <5 (L)  6 - 20 mg/dL   Creatinine, Ser 0.47 0.44 - 1.00 mg/dL   Calcium 8.6 (L) 8.9 - 10.3 mg/dL   GFR calc non Af Amer >60 >60 mL/min   GFR calc Af Amer >60 >60 mL/min   Anion gap 12 5 - 15    Comment: Performed at [facility], [address] [place], [place] [address]  CBC with Differential/Platelet     Status: Abnormal   Collection Time: 11/30/19  6:29 PM  Result Value Ref Range   WBC 22.2 (H) 4.0 - 10.5 K/uL   RBC 3.99 3.87 - 5.11 MIL/uL   Hemoglobin 11.5 (L) 12.0 - 15.0 g/dL   HCT 34.9 (L) 36.0 - 46.0 %   MCV 87.5 80.0 - 100.0 fL   MCH 28.8 26.0 - 34.0 pg   MCHC 33.0 30.0 - 36.0 g/dL   RDW 12.6 11.5 - 15.5 %   Platelets 249 150 - 400 K/uL   nRBC 0.0 0.0 - 0.2 %   Neutrophils Relative % 91 %   Neutro Abs 20.4 (H) 1.7 - 7.7 K/uL   Lymphocytes Relative 5 %   Lymphs Abs 1.0 0.7 - 4.0 K/uL   Monocytes Relative 2 %   Monocytes Absolute 0.4 0.1 - 1.0 K/uL   Eosinophils Relative 0 %   Eosinophils Absolute 0.0 0.0 - 0.5 K/uL   Basophils Relative 0 %   Basophils Absolute 0.0 0.0 - 0.1 K/uL   Immature Granulocytes 2 %   Abs Immature Granulocytes 0.34 (H) 0.00 - 0.07 K/uL    Comment: Performed at [facility], [address] [place], [place] [address]  Lactic acid, plasma     Status: None   Collection Time: 11/30/19  8:24 PM  Result Value Ref Range   Lactic Acid, Venous 1.7 0.5 - 1.9 mmol/L    Comment: Performed at [facility], [address] [place], [place] [address]  Triglycerides     Status: Abnormal   Collection Time: 11/30/19  8:24 PM  Result Value Ref Range   Triglycerides 317 (H) <150 mg/dL    Comment: Performed at [facility], [address] [place], [place] [address]  Fibrinogen     Status: Abnormal   Collection Time: 11/30/19  8:24 PM  Result Value Ref Range   Fibrinogen 485 (H) 210 - 475 mg/dL    Comment: Performed at [facility], [address] [place], [place] [address]  POC SARS Coronavirus 2 Ag     Status:  None   Collection Time: 11/30/19  9:38 PM  Result Value Ref Range   SARS Coronavirus 2 Ag NEGATIVE NEGATIVE    Comment: (NOTE) SARS-CoV-2 antigen NOT DETECTED.  Negative results are presumptive.  Negative results do not preclude SARS-CoV-2 infection and should not be used as the sole basis for treatment or other patient management decisions, including infection  control decisions, particularly in the presence of clinical signs and  symptoms consistent with COVID-19, or in those who have been in contact with the virus.  Negative results must be combined with clinical observations, patient history, and epidemiological information. The expected result is Negative. Fact Sheet for Patients: PodPark.tn Fact Sheet for Healthcare Providers: GiftContent.is This test is not yet approved or cleared by the [place] FDA and  has been authorized for detection and/or diagnosis of SARS-CoV-2 by FDA under an Emergency Use Authorization (EUA).  This EUA will remain in effect (meaning this test can be used) for the duration of  the COVID-19 de claration under Section 564(b)(1) of the Act, 21 U.S.C. section 360bbb-3(b)(1), unless the authorization is terminated or revoked sooner.     Imaging DG Chest Port 1 View  Result Date: 11/30/2019 CLINICAL DATA:  Shortness of breath, fever, [redacted] weeks pregnant COVID positive EXAM: PORTABLE CHEST 1 VIEW COMPARISON:  11/29/2019 FINDINGS: Multifocal bilateral ground-glass  opacities and vague consolidations slightly worse in the upper lobes and right base. Stable cardiomediastinal silhouette. Possible small left effusion. No pneumothorax. IMPRESSION: 1. Slight interval worsening of bilateral airspace disease compatible with multifocal pneumonia. 2. Probable small left-sided pleural effusion Electronically Signed   By: Donavan Foil M.D.   On: 11/30/2019 20:48   DG Chest Port 1 View  Result Date:  11/29/2019 CLINICAL DATA:  Cough, fever, COVID-19 positive EXAM: PORTABLE CHEST 1 VIEW COMPARISON:  None. FINDINGS: The heart size and mediastinal contours are within normal limits. Streaky perihilar and bibasilar airspace opacities. No pleural effusion or pneumothorax. The visualized skeletal structures are unremarkable. IMPRESSION: Streaky perihilar and bibasilar airspace opacities suspicious for multifocal viral/atypical pneumonia in the setting of known viral illness. Electronically Signed   By: Davina Poke D.O.   On: 11/29/2019 13:25    Assessment: 34 y.o. G2P1001 at [redacted]w[redacted]d with COVID PNA  Plan:  1) COVID pneumonia - appreciate hospitalist service in assisting in the care of this patient.   - discussed with patient that dexamethasone and steroids in general are considered safe in pregnancy - if concern for bacterial PNA on top of COVID PNA azithromycin is acceptable - Remdesivir may be used if needed - Tylenol prn for fever avoid NSAID's such as ibuprofen   2) Fetus - once daily NST ordered and will be performed by labor and delivery staff.  - continue once daily prenatal MVI  3) DVT ppx - agree with prophylactic dose anticoagulation with heparin, Lovenox would also be acceptable given patient is remote from delivery  Malachy Mood, MD, [place], [place] 11/30/2019, 10:02 PM

## 2019-11-30 NOTE — ED Notes (Signed)
Pt st taking tylenol at "4pm" today.

## 2019-12-01 DIAGNOSIS — J9601 Acute respiratory failure with hypoxia: Secondary | ICD-10-CM | POA: Diagnosis not present

## 2019-12-01 DIAGNOSIS — U071 COVID-19: Secondary | ICD-10-CM

## 2019-12-01 DIAGNOSIS — J189 Pneumonia, unspecified organism: Secondary | ICD-10-CM | POA: Diagnosis not present

## 2019-12-01 DIAGNOSIS — Z3493 Encounter for supervision of normal pregnancy, unspecified, third trimester: Secondary | ICD-10-CM | POA: Diagnosis not present

## 2019-12-01 LAB — CBC
HCT: 32.7 % — ABNORMAL LOW (ref 36.0–46.0)
Hemoglobin: 10.7 g/dL — ABNORMAL LOW (ref 12.0–15.0)
MCH: 28.9 pg (ref 26.0–34.0)
MCHC: 32.7 g/dL (ref 30.0–36.0)
MCV: 88.4 fL (ref 80.0–100.0)
Platelets: 217 10*3/uL (ref 150–400)
RBC: 3.7 MIL/uL — ABNORMAL LOW (ref 3.87–5.11)
RDW: 12.7 % (ref 11.5–15.5)
WBC: 23 10*3/uL — ABNORMAL HIGH (ref 4.0–10.5)
nRBC: 0 % (ref 0.0–0.2)

## 2019-12-01 LAB — RESPIRATORY PANEL BY RT PCR (FLU A&B, COVID)
Influenza A by PCR: NEGATIVE
Influenza B by PCR: NEGATIVE
SARS Coronavirus 2 by RT PCR: POSITIVE — AB

## 2019-12-01 LAB — PROTIME-INR
INR: 0.9 (ref 0.8–1.2)
Prothrombin Time: 11.7 seconds (ref 11.4–15.2)

## 2019-12-01 LAB — FERRITIN: Ferritin: 141 ng/mL (ref 11–307)

## 2019-12-01 LAB — COMPREHENSIVE METABOLIC PANEL
ALT: 16 U/L (ref 0–44)
AST: 31 U/L (ref 15–41)
Albumin: 2.9 g/dL — ABNORMAL LOW (ref 3.5–5.0)
Alkaline Phosphatase: 101 U/L (ref 38–126)
Anion gap: 11 (ref 5–15)
BUN: <5 mg/dL — ABNORMAL LOW (ref 6–20)
CO2: 18 mmol/L — ABNORMAL LOW (ref 22–32)
Calcium: 8.1 mg/dL — ABNORMAL LOW (ref 8.9–10.3)
Chloride: 112 mmol/L — ABNORMAL HIGH (ref 98–111)
Creatinine, Ser: 0.42 mg/dL — ABNORMAL LOW (ref 0.44–1.00)
GFR calc Af Amer: >60 mL/min (ref >60)
GFR calc non Af Amer: >60 mL/min (ref >60)
Glucose, Bld: 118 mg/dL — ABNORMAL HIGH (ref 70–99)
Potassium: 3.5 mmol/L (ref 3.5–5.1)
Sodium: 141 mmol/L (ref 135–145)
Total Bilirubin: 0.7 mg/dL (ref 0.3–1.2)
Total Protein: 6.2 g/dL — ABNORMAL LOW (ref 6.5–8.1)

## 2019-12-01 LAB — APTT: aPTT: 37 seconds — ABNORMAL HIGH (ref 24–36)

## 2019-12-01 LAB — PHOSPHORUS: Phosphorus: 2.7 mg/dL (ref 2.5–4.6)

## 2019-12-01 LAB — URINALYSIS, ROUTINE W REFLEX MICROSCOPIC
Glucose, UA: NEGATIVE mg/dL
Ketones, ur: >160 mg/dL — AB
Nitrite: NEGATIVE
Protein, ur: 100 mg/dL — AB
Specific Gravity, Urine: 1.02 (ref 1.005–1.030)
pH: 6.5 (ref 5.0–8.0)

## 2019-12-01 LAB — C-REACTIVE PROTEIN
CRP: 11.9 mg/dL — ABNORMAL HIGH (ref <1.0)
CRP: 13.4 mg/dL — ABNORMAL HIGH (ref <1.0)

## 2019-12-01 LAB — MAGNESIUM: Magnesium: 1.5 mg/dL — ABNORMAL LOW (ref 1.7–2.4)

## 2019-12-01 LAB — HIV ANTIBODY (ROUTINE TESTING W REFLEX): HIV Screen 4th Generation wRfx: NONREACTIVE

## 2019-12-01 LAB — D-DIMER, QUANTITATIVE
D-Dimer, Quant: 1.52 ug/mL-FEU — ABNORMAL HIGH (ref 0.00–0.50)
D-Dimer, Quant: 1.02 ug/mL-FEU — ABNORMAL HIGH (ref 0.00–0.50)

## 2019-12-01 MED ORDER — HYDROCODONE-ACETAMINOPHEN 5-325 MG PO TABS: 1.0000 | ORAL_TABLET | ORAL | 0 refills | Status: AC | PRN

## 2019-12-01 MED ORDER — POTASSIUM CHLORIDE 2 MEQ/ML IV SOLN: 100.0000 mL/h | INTRAVENOUS | 0 refills | Status: AC

## 2019-12-01 MED ORDER — HYDROCOD POLST-CPM POLST ER 10-8 MG/5ML PO SUER: 5.0000 mL | Freq: Two times a day (BID) | ORAL | Status: AC | PRN

## 2019-12-01 MED ORDER — PRENATAL MULTIVITAMIN CH: 1.0000 | ORAL_TABLET | Freq: Every day | ORAL | Status: AC

## 2019-12-01 MED ORDER — ASCORBIC ACID 500 MG PO TABS: 500.0000 mg | ORAL_TABLET | Freq: Every day | ORAL | Status: AC

## 2019-12-01 MED ORDER — POLYETHYLENE GLYCOL 3350 17 G PO PACK: 17.0000 g | PACK | Freq: Every day | ORAL | 0 refills | Status: AC | PRN

## 2019-12-01 MED ORDER — ACETAMINOPHEN 325 MG PO TABS: 650.0000 mg | ORAL_TABLET | Freq: Four times a day (QID) | ORAL | Status: AC | PRN

## 2019-12-01 MED ORDER — HEPARIN (PORCINE) 25000 UT/250ML-% IV SOLN: 1050.0000 [IU]/h | INTRAVENOUS | Status: DC

## 2019-12-01 MED ORDER — BENZONATATE 100 MG PO CAPS
200.0000 mg | ORAL_CAPSULE | Freq: Three times a day (TID) | ORAL | Status: DC | PRN
  Administered 2019-12-01: 21:00:00 200 mg via ORAL
  Filled 2019-12-01: qty 2

## 2019-12-01 MED ORDER — ZINC SULFATE 220 (50 ZN) MG PO CAPS: 220.0000 mg | ORAL_CAPSULE | Freq: Every day | ORAL | Status: AC

## 2019-12-01 MED ORDER — ONDANSETRON HCL 4 MG PO TABS: 4.0000 mg | ORAL_TABLET | Freq: Four times a day (QID) | ORAL | 0 refills | Status: AC | PRN

## 2019-12-01 MED ORDER — POTASSIUM CHLORIDE 20 MEQ PO PACK
40.0000 meq | PACK | Freq: Two times a day (BID) | ORAL | Status: AC
  Administered 2019-12-01 (×2): 40 meq via ORAL
  Filled 2019-12-01 (×2): qty 2

## 2019-12-01 MED ORDER — HEPARIN SODIUM (PORCINE) 5000 UNIT/ML IJ SOLN: 5000.0000 [IU] | Freq: Three times a day (TID) | INTRAMUSCULAR | Status: AC

## 2019-12-01 MED ORDER — DEXAMETHASONE SODIUM PHOSPHATE 10 MG/ML IJ SOLN: 6.0000 mg | INTRAMUSCULAR | 0 refills | Status: AC

## 2019-12-01 MED ORDER — GUAIFENESIN-DM 100-10 MG/5ML PO SYRP: 10.0000 mL | ORAL_SOLUTION | ORAL | 0 refills | Status: AC | PRN

## 2019-12-01 MED ORDER — BENZONATATE 200 MG PO CAPS: 200.0000 mg | ORAL_CAPSULE | Freq: Three times a day (TID) | ORAL | 0 refills | Status: AC | PRN

## 2019-12-01 NOTE — ED Notes (Signed)
Per Arbutus Ped, DO, [facility] has no available covid beds at this time. Agricultural consultant notified.

## 2019-12-01 NOTE — ED Notes (Signed)
Resting quietly at this time.  No complaints at this time.

## 2019-12-01 NOTE — ED Notes (Signed)
Pt provided with apple sauce, water and crackers per pt's request.

## 2019-12-01 NOTE — ED Notes (Signed)
[facility] Agricultural consultant called at this time- states that trucks will be tied up until late tonight, offers air ambulance if MD requests. MD paged- awaiting response. Will call back [phone]) when notified.

## 2019-12-01 NOTE — ED Notes (Signed)
Pt provided breakfast tray. Pt stated "I'm a little hungry." lying on stretcher, no distress noted at this time.

## 2019-12-01 NOTE — ED Notes (Signed)
OB called at this time- state they are on way to do fetal stress-test.

## 2019-12-01 NOTE — ED Notes (Signed)
Pt accepted at [facility], call report number [phone].  Accepting MD Cliffton Asters.  Transfer Coordinator name is Charity fundraiser.  Primary RN Doretha Sou and Horticulturist, commercial aware.

## 2019-12-01 NOTE — ED Notes (Signed)
facesheet faxed to [facility] per Network engineer.

## 2019-12-01 NOTE — ED Notes (Signed)
Pt provided meal tray. MD states ambulance is appropriate.

## 2019-12-01 NOTE — ED Notes (Signed)
Pt sat 91% at this time. This RN placed pt on 3L sat 95%.

## 2019-12-01 NOTE — ED Notes (Addendum)
Pt desating to 85% (see flowsheet) despite oxygen increase to 6 liters. Pt switched to non-reabreather 15 liters at this time. Arbutus Ped, MD notified.

## 2019-12-01 NOTE — ED Notes (Signed)
Pt ambulatory to toilet with steady gait noted.  

## 2019-12-01 NOTE — ED Notes (Addendum)
Requested transfer to [facility] per nursing supervisor request due to bed shortage and pt condition, and [facility], or [facility] per pt request.

## 2019-12-01 NOTE — ED Notes (Signed)
Pt c/o increased SOB- states norco relieved previously. Oxygen increased to 6 l. Will medicate and reassess.

## 2019-12-01 NOTE — ED Notes (Addendum)
[facility] given report at this time. No trucks available at this time- state they will call back with ETA.

## 2019-12-01 NOTE — ED Notes (Signed)
[facility] called back at this time- asking if MD responded. MD off service, will message covering provider.

## 2019-12-01 NOTE — ED Notes (Signed)
Pt states she can feel baby move and kick every hour

## 2019-12-01 NOTE — ED Notes (Signed)
[facility] states ETA for truck is 2-3AM

## 2019-12-01 NOTE — ED Notes (Signed)
Pt assisted to toilet 

## 2019-12-01 NOTE — ED Notes (Signed)
Sharion Settler, NP messaged regarding pt's transfer status.

## 2019-12-01 NOTE — ED Notes (Signed)
Pt has been accepted to [facility]. Rm [id number]. Accepting MD is Cliffton Asters. Call report number is [phone]. Transfer coordinator is [place] her number is [phone].

## 2019-12-01 NOTE — ED Notes (Signed)
Attempted to call report at this time. Per Denny Peon, RN pt will not be accepted to inpatient at this time.

## 2019-12-01 NOTE — ED Notes (Addendum)
Messaged Arbutus Ped, DO, Gloriann Loan DO, and OB MD and asked about current plan of care for pt per charge RN request.

## 2019-12-01 NOTE — ED Notes (Signed)
Pt states SOB becomes worse when she gets up and walks to toilet. Increased oxygen to 4L [place] for comfort.

## 2019-12-01 NOTE — Progress Notes (Signed)
PROGRESS NOTE    Heather Horn  [id number] DOB: Oct 12, 1986 DOA: 11/30/2019  PCP: [place]    LOS - 1   Brief Narrative:  34 y.o. female, [redacted] weeks pregnant, who presented to ED for worsening shortness of breath over last few days.  She had presented the day before, found COVID-19 positive, discharge with steroids, but her breathing got worse and she noted her oxygen sat on room air to by 87%.  In the ED, patient requiring supplemental oxygen.  CXR showed worsening bilateral airspace disease consistent with multifocal pneumonia.  Treated with Decadron and started on remdesivir, admitted to hospitalist service for further management of COVID-19 pneumonia.  Obstetrics to follow regarding pregnancy.    Have attempted transfer today, given [redacted] weeks pregnant, ideally patient needs to be at a larger facility:  - [facility] has no covid beds  - [facility] also no beds available but have patient on their wait list - [facility] - awaiting call back  Subjective 1/3: Patient seen in ED holding for a bed.  She reports ongoing shortness of breath.  It gets much worse when up to use bathroom and she states it takes a long time to recover when she lays back down.  Reports having a dry cough and diarrhea. No nausea or vomiting.    Assessment & Plan:   Principal Problem:   Pneumonia Active Problems:   Pregnant and not yet delivered in third trimester   COVID-19 virus infection   Acute respiratory failure with hypoxia (HCC)   Tachycardia   Leukocytosis  Acute Respiratory Failure with Hypoxia secondary to COVID-19 Pneumonia Patient is very ill, significant tachypnea, tachycardia and hypoxia requiring up to 4 L/min oxygen today.   Attempting to transfer patient (as above), given [redacted] weeks pregnant.   --continue Decadron IV - 10 day steroid course --continue remdesivir per pharmacy  --continue vitamin C and zinc --supplemental oxygen, goal sat > 90% --monitor inflammatory  markers --continue LR maintenance fluids --monitor respiratory status very closely, her vitals are concerning at this point --continue to arrange to transfer   Sinus Tachycardia - secondary to infection above. --monitor closely --IV fluids as above  Hypokalemia - repleted.  K was 2.8 on admission. --monitor BMP and Magnesium --replete as needed for target K>4.0  Hypomagnesemia - Mg 1.5 this AM, repleting IV --monitor daily and replete as needed for target Mg>2.0  Pregnant in the third trimester -Patient is [redacted] weeks pregnant, so far uncomplicated pregnancy. -OB/GYN is following and have made fetal monitoring recommendations, see their notes.   DVT prophylaxis: heparin   Code Status: Full Code  Family Communication: none at bedside  Disposition Plan:  Pending clinical improvement, expect will discharge home   Consultants:   OB/GYN  Procedures:   None  Antimicrobials:   None    Objective: Vitals:   12/01/19 1036 12/01/19 1100 12/01/19 1146 12/01/19 1230  BP:   123/82   Pulse:  (!) 129 (!) 142 (!) 119  Resp:  (!) 40  (!) 40  Temp: 98.9 F (37.2 C)     TempSrc: Oral     SpO2:  94% 94% 100%    Intake/Output Summary (Last 24 hours) at 12/01/2019 1349 Last data filed at 12/01/2019 [id number] Gross per 24 hour  Intake 859.13 ml  Output --  Net 859.13 ml   There were no vitals filed for this visit.  Examination:  General exam: awake, alert, no acute distress, ill appearing HEENT: moist mucus membranes, hearing  grossly normal  Respiratory system: crackles bilaterally, otherwise diminished, no wheezes or rhonchi, visible conversational dyspnea Cardiovascular system: normal S1/S2, RRR, no JVD, murmurs, rubs, gallops, no pedal edema.   Gastrointestinal system: soft, non-tender, non-distended abdomen, normal bowel sounds. Central nervous system: alert and oriented x4. no gross focal neurologic deficits, normal speech Extremities: moves all, no cyanosis, normal  tone Skin: dry, intact, normal temperature Psychiatry: normal mood, congruent affect, judgement and insight appear normal    Data Reviewed: I have personally reviewed following labs and imaging studies  CBC: Recent Labs  Lab 11/29/19 1249 11/30/19 1829 12/01/19 0504  WBC 13.2* 22.2*  21.8* 23.0*  NEUTROABS 11.3* 20.4*  --   HGB 11.8* 11.5*  11.6* 10.7*  HCT 35.4* 34.9*  32.9* 32.7*  MCV 86.1 87.5  82.5 88.4  PLT 220 249  225 [id number]   Basic Metabolic Panel: Recent Labs  Lab 11/29/19 1249 11/30/19 1829 12/01/19 0504  NA 135 139 141  K 2.6* 2.8* 3.5  CL 104 108 112*  CO2 19* 19* 18*  GLUCOSE 106* 121* 118*  BUN <5* <5* <5*  CREATININE 0.50 0.47 0.42*  CALCIUM 8.1* 8.6* 8.1*  MG  --   --  1.5*  PHOS  --   --  2.7   GFR: Estimated Creatinine Clearance: 94.8 mL/min (A) (by C-G formula based on SCr of 0.42 mg/dL (L)). Liver Function Tests: Recent Labs  Lab 11/30/19 2024 12/01/19 0504  AST 33 31  ALT 18 16  ALKPHOS 109 101  BILITOT 0.7 0.7  PROT 6.9 6.2*  ALBUMIN 3.2* 2.9*   No results for input(s): LIPASE, AMYLASE in the last 168 hours. No results for input(s): AMMONIA in the last 168 hours. Coagulation Profile: Recent Labs  Lab 12/01/19 0504  INR 0.9   Cardiac Enzymes: No results for input(s): CKTOTAL, CKMB, CKMBINDEX, TROPONINI in the last 168 hours. BNP (last 3 results) No results for input(s): PROBNP in the last 8760 hours. HbA1C: No results for input(s): HGBA1C in the last 72 hours. CBG: No results for input(s): GLUCAP in the last 168 hours. Lipid Profile: Recent Labs    11/30/19 2024  TRIG 317*   Thyroid Function Tests: No results for input(s): TSH, T4TOTAL, FREET4, T3FREE, THYROIDAB in the last 72 hours. Anemia Panel: Recent Labs    11/30/19 2024 12/01/19 0504  FERRITIN 138 141   Sepsis Labs: Recent Labs  Lab 11/30/19 2024  PROCALCITON 0.92  LATICACIDVEN 1.7    Recent Results (from the past 240 hour(s))  Blood Culture  (routine x 2)     Status: None (Preliminary result)   Collection Time: 11/30/19  8:24 PM   Specimen: BLOOD  Result Value Ref Range Status   Specimen Description BLOOD LEFT ANTECUBITAL  Final   Special Requests   Final    BOTTLES DRAWN AEROBIC AND ANAEROBIC Blood Culture adequate volume   Culture   Final    NO GROWTH < 12 HOURS Performed at [facility], [address], [place], [place] [address]    Report Status PENDING  Incomplete  Blood Culture (routine x 2)     Status: None (Preliminary result)   Collection Time: 11/30/19  8:44 PM   Specimen: BLOOD  Result Value Ref Range Status   Specimen Description BLOOD RIGHT HAND  Final   Special Requests   Final    BOTTLES DRAWN AEROBIC AND ANAEROBIC Blood Culture adequate volume   Culture   Final    NO GROWTH < 12 HOURS  Performed at [facility], [address] [place], [place] [address]    Report Status PENDING  Incomplete  Respiratory Panel by RT PCR (Flu A&B, Covid) - Nasopharyngeal Swab     Status: Abnormal   Collection Time: 11/30/19 10:16 PM   Specimen: Nasopharyngeal Swab  Result Value Ref Range Status   SARS Coronavirus 2 by RT PCR POSITIVE (A) NEGATIVE Corrected    Comment: YESSICA AGUAS 11/30/2019 AT 2323 HS (NOTE) SARS-CoV-2 target nucleic acids are DETECTED. SARS-CoV-2 RNA is generally detectable in upper respiratory specimens  during the acute phase of infection. Positive results are indicative of the presence of the identified virus, but do not rule out bacterial infection or co-infection with other pathogens not detected by the test. Clinical correlation with patient history and other diagnostic information is necessary to determine patient infection status. The expected result is Negative. Fact Sheet for Patients:  PinkCheek.be Fact Sheet for Healthcare Providers: GravelBags.it This test is not yet approved or cleared by the [place]  FDA and  has been authorized for detection and/or diagnosis of SARS-CoV-2 by FDA under an Emergency Use Authorization (EUA).  This EUA will remain in effect (meaning this test can be used) for the duration of  the COVID-19 declaration under S ection 564(b)(1) of the Act, 21 U.S.C. section 360bbb-3(b)(1), unless the authorization is terminated or revoked sooner. CORRECTED ON 01/03 AT [id number]: PREVIOUSLY REPORTED AS POSITIVE RESULT CALLED TO, READ BACK BY AND VERIFIED WITH: YESSICA AGUAS 11/29/2018 AT 2323 HS    Influenza A by PCR NEGATIVE NEGATIVE Final   Influenza B by PCR NEGATIVE NEGATIVE Final    Comment: (NOTE) The Xpert Xpress SARS-CoV-2/FLU/RSV assay is intended as an aid in  the diagnosis of influenza from Nasopharyngeal swab specimens and  should not be used as a sole basis for treatment. Nasal washings and  aspirates are unacceptable for Xpert Xpress SARS-CoV-2/FLU/RSV  testing. Fact Sheet for Patients: PinkCheek.be Fact Sheet for Healthcare Providers: GravelBags.it This test is not yet approved or cleared by the [place] FDA and  has been authorized for detection and/or diagnosis of SARS-CoV-2 by  FDA under an Emergency Use Authorization (EUA). This EUA will remain  in effect (meaning this test can be used) for the duration of the  Covid-19 declaration under Section 564(b)(1) of the Act, 21  U.S.C. section 360bbb-3(b)(1), unless the authorization is  terminated or revoked. Performed at [facility], [address] [place], [place] [address]          Radiology Studies: DG Chest [place] 1 View  Result Date: 11/30/2019 CLINICAL DATA:  Shortness of breath, fever, [redacted] weeks pregnant COVID positive EXAM: PORTABLE CHEST 1 VIEW COMPARISON:  11/29/2019 FINDINGS: Multifocal bilateral ground-glass opacities and vague consolidations slightly worse in the upper lobes and right base. Stable cardiomediastinal  silhouette. Possible small left effusion. No pneumothorax. IMPRESSION: 1. Slight interval worsening of bilateral airspace disease compatible with multifocal pneumonia. 2. Probable small left-sided pleural effusion Electronically Signed   By: Donavan Foil M.D.   On: 11/30/2019 20:48        Scheduled Meds: . vitamin C  500 mg Oral Daily  . dexamethasone (DECADRON) injection  6 mg Intravenous Q24H  . heparin  5,000 Units Subcutaneous Q8H  . prenatal multivitamin  1 tablet Oral Q1200  . zinc sulfate  220 mg Oral Daily   Continuous Infusions: . lactated ringers with kcl 125 mL/hr at 12/01/19 0909     LOS: 1 day  Time spent: 55-60 minutes    Ezekiel Slocumb, DO [facility]   If 7PM-7AM, please contact night-coverage [email or web address] Password [email or web address] 12/01/2019, 1:49 PM

## 2019-12-01 NOTE — ED Notes (Signed)
Patient resting quietly, easily aroused.  Patient reports feeling baby move.

## 2019-12-01 NOTE — ED Notes (Signed)
Per MD, pt accepted at [facility] to [facility] service by Dr. Bonna Gains at this time. Transport to call.

## 2019-12-01 NOTE — ED Notes (Signed)
Resting quietly with eyes closed, no acute distress noted. °

## 2019-12-01 NOTE — Progress Notes (Signed)
NST complete. FHR baseline 150-155 with accelerations up to 171. No contractions present. Fetal movement audible. Dr. Kenton Kingfisher made aware. Dineen Kid

## 2019-12-01 NOTE — ED Notes (Signed)
Pt assisted to toilet. Pt states she has heartburn, is nauseous, and feels sob. Pt ambulates well.

## 2019-12-01 NOTE — Discharge Summary (Signed)
Physician Discharge Summary  Dawanna Lensch [id number] DOB: Aug 14, 1986 DOA: 11/30/2019  PCP: [place] date: 11/30/2019 Discharge date: 12/01/2019  Admitted From: Home  Disposition:  Transfer to [facility]  Recommendations for Outpatient Follow-up:  Per [facility] team on discharge  Home Health: n/a Equipment/Devices: n/a   Discharge Condition: guarded  CODE STATUS: full Diet recommendation: regular  Brief/Interim Summary:  34 y.o.female, [redacted] weeks pregnant, who presented to ED for worsening shortness of breath over last few days.  She had presented the day before, found COVID-19 positive, discharge with steroids, but her breathing got worse and she noted her oxygen sat on room air to by 87%.  In the ED, patient requiring supplemental oxygen.  CXR showed worsening bilateral airspace disease consistent with multifocal pneumonia.  Treated with Decadron and started on remdesivir, admitted to hospitalist service for further management of COVID-19 pneumonia.  Obstetrics to follow regarding pregnancy.  Has been requiring 4 L/min oxygen this afternoon.  Remains mildly tachycardic and intermittently tachypnpeic.    Patient is being transferred to [facility], given lack of beds for covid+ pregnant patient here and at [facility] at this time.  Accepted to fetal-maternal-medicine service, Dr. Bonna Gains.   Discharge Diagnoses: Principal Problem:   Pneumonia Active Problems:   Pregnant and not yet delivered in third trimester   COVID-19 virus infection   Acute respiratory failure with hypoxia (HCC)   Tachycardia   Leukocytosis  Acute Respiratory Failure with Hypoxia secondary to COVID-19 Pneumonia Patient is very ill, significant tachypnea, tachycardia and hypoxia requiring up to 4 L/min oxygen today.   Attempting to transfer patient (as above), given [redacted] weeks pregnant.   --continue Decadron IV - 10 day steroid course --continue remdesivir per pharmacy  --continue vitamin C and  zinc --supplemental oxygen, goal sat > 90% --monitor inflammatory markers --continue LR maintenance fluids --monitor respiratory status very closely, her vitals are concerning at this point --continue to arrange to transfer   Sinus Tachycardia - secondary to infection above. --monitor closely --IV fluids as above  Hypokalemia - repleted.  K was 2.8 on admission. --monitor BMP and Magnesium --replete as needed for target K>4.0  Hypomagnesemia - Mg 1.5 this AM, repleting IV --monitor daily and replete as needed for target Mg>2.0  Pregnant in the third trimester -Patient is [redacted] weeks pregnant, so far uncomplicated pregnancy. -OB/GYN is following and have made fetal monitoring recommendations, see their notes.   Discharge Instructions   Discharge Instructions    Diet - low sodium heart healthy   Complete by: As directed    Increase activity slowly   Complete by: As directed      Allergies as of 12/01/2019   No Known Allergies     Medication List    STOP taking these medications   methylPREDNISolone 4 MG Tbpk tablet Commonly known as: Medrol     TAKE these medications   acetaminophen 325 MG tablet Commonly known as: TYLENOL Take 650 mg by mouth every 6 (six) hours as needed. What changed: Another medication with the same name was added. Make sure you understand how and when to take each.   acetaminophen 325 MG tablet Commonly known as: TYLENOL Take 2 tablets (650 mg total) by mouth every 6 (six) hours as needed for mild pain (or Fever >/= 101). What changed: You were already taking a medication with the same name, and this prescription was added. Make sure you understand how and when to take each.   ascorbic acid 500  MG tablet Commonly known as: VITAMIN C Take 1 tablet (500 mg total) by mouth daily. Start taking on: December 02, 2019   benzonatate 200 MG capsule Commonly known as: TESSALON Take 1 capsule (200 mg total) by mouth 3 (three) times daily as needed  for cough.   chlorpheniramine-HYDROcodone 10-8 MG/5ML Suer Commonly known as: TUSSIONEX Take 5 mLs by mouth every 12 (twelve) hours as needed for cough.   dexamethasone 10 MG/ML injection Commonly known as: DECADRON Inject 0.6 mLs (6 mg total) into the vein daily.   guaiFENesin-dextromethorphan 100-10 MG/5ML syrup Commonly known as: ROBITUSSIN DM Take 10 mLs by mouth every 4 (four) hours as needed for cough.   heparin 5000 UNIT/ML injection Inject 1 mL (5,000 Units total) into the skin every 8 (eight) hours.   HYDROcodone-acetaminophen 5-325 MG tablet Commonly known as: NORCO/VICODIN Take 1-2 tablets by mouth every 4 (four) hours as needed for moderate pain.   ondansetron 4 MG tablet Commonly known as: ZOFRAN Take 1 tablet (4 mg total) by mouth every 6 (six) hours as needed for nausea.   polyethylene glycol 17 g packet Commonly known as: MIRALAX / GLYCOLAX Take 17 g by mouth daily as needed for mild constipation.   potassium chloride 20 mEq in lactated ringers 1,000 mL Inject 100 mL/hr into the vein continuous.   prenatal multivitamin Tabs tablet Take 1 tablet by mouth daily at 12 noon. Start taking on: December 02, 2019 What changed: medication strength   zinc sulfate 220 (50 Zn) MG capsule Take 1 capsule (220 mg total) by mouth daily. Start taking on: December 02, 2019       No Known Allergies  Consultations:  OB/GYN   Procedures/Studies: DG Chest Port 1 View  Result Date: 11/30/2019 CLINICAL DATA:  Shortness of breath, fever, [redacted] weeks pregnant COVID positive EXAM: PORTABLE CHEST 1 VIEW COMPARISON:  11/29/2019 FINDINGS: Multifocal bilateral ground-glass opacities and vague consolidations slightly worse in the upper lobes and right base. Stable cardiomediastinal silhouette. Possible small left effusion. No pneumothorax. IMPRESSION: 1. Slight interval worsening of bilateral airspace disease compatible with multifocal pneumonia. 2. Probable small left-sided pleural  effusion Electronically Signed   By: Donavan Foil M.D.   On: 11/30/2019 20:48   DG Chest Port 1 View  Result Date: 11/29/2019 CLINICAL DATA:  Cough, fever, COVID-19 positive EXAM: PORTABLE CHEST 1 VIEW COMPARISON:  None. FINDINGS: The heart size and mediastinal contours are within normal limits. Streaky perihilar and bibasilar airspace opacities. No pleural effusion or pneumothorax. The visualized skeletal structures are unremarkable. IMPRESSION: Streaky perihilar and bibasilar airspace opacities suspicious for multifocal viral/atypical pneumonia in the setting of known viral illness. Electronically Signed   By: Davina Poke D.O.   On: 11/29/2019 13:25       Subjective: patient feels the same, still very SOB especially when up to use bathroom.  Wakes up from sleep short of breath.  Encouraged slow deep breaths.  Patient states deep breathing causes her coughing.   Discharge Exam: Vitals:   12/01/19 1630 12/01/19 1745  BP:    Pulse: (!) 109 (!) 101  Resp: 18 19  Temp:    SpO2: 94% 90%   Vitals:   12/01/19 1430 12/01/19 1600 12/01/19 1630 12/01/19 1745  BP:      Pulse: (!) 115 (!) 105 (!) 109 (!) 101  Resp: (!) 25 20 18 19   Temp:      TempSrc:      SpO2: 93% 94% 94% 90%    General:  Pt is alert, awake, not in acute distress Cardiovascular: RRR, S1/S2 +, no rubs, no gallops Respiratory: basilar crackles bilaterally, no wheezing, no rhonchi Abdominal: protuberant consistent with pregnanacy,  NT, ND, bowel sounds + Extremities: no edema, no cyanosis    The results of significant diagnostics from this hospitalization (including imaging, microbiology, ancillary and laboratory) are listed below for reference.     Microbiology: Recent Results (from the past 240 hour(s))  Blood Culture (routine x 2)     Status: None (Preliminary result)   Collection Time: 11/30/19  8:24 PM   Specimen: BLOOD  Result Value Ref Range Status   Specimen Description BLOOD LEFT ANTECUBITAL  Final    Special Requests   Final    BOTTLES DRAWN AEROBIC AND ANAEROBIC Blood Culture adequate volume   Culture   Final    NO GROWTH < 12 HOURS Performed at [facility], [address] [place], [place] [address]    Report Status PENDING  Incomplete  Blood Culture (routine x 2)     Status: None (Preliminary result)   Collection Time: 11/30/19  8:44 PM   Specimen: BLOOD  Result Value Ref Range Status   Specimen Description BLOOD RIGHT HAND  Final   Special Requests   Final    BOTTLES DRAWN AEROBIC AND ANAEROBIC Blood Culture adequate volume   Culture   Final    NO GROWTH < 12 HOURS Performed at [facility], [address] [place], [place] [address]    Report Status PENDING  Incomplete  Respiratory Panel by RT PCR (Flu A&B, Covid) - Nasopharyngeal Swab     Status: Abnormal   Collection Time: 11/30/19 10:16 PM   Specimen: Nasopharyngeal Swab  Result Value Ref Range Status   SARS Coronavirus 2 by RT PCR POSITIVE (A) NEGATIVE Corrected    Comment: Heather Horn 11/30/2019 AT 2323 HS (NOTE) SARS-CoV-2 target nucleic acids are DETECTED. SARS-CoV-2 RNA is generally detectable in upper respiratory specimens  during the acute phase of infection. Positive results are indicative of the presence of the identified virus, but do not rule out bacterial infection or co-infection with other pathogens not detected by the test. Clinical correlation with patient history and other diagnostic information is necessary to determine patient infection status. The expected result is Negative. Fact Sheet for Patients:  PinkCheek.be Fact Sheet for Healthcare Providers: GravelBags.it This test is not yet approved or cleared by the [place] FDA and  has been authorized for detection and/or diagnosis of SARS-CoV-2 by FDA under an Emergency Use Authorization (EUA).  This EUA will remain in effect (meaning this test can be used) for  the duration of  the COVID-19 declaration under S ection 564(b)(1) of the Act, 21 U.S.C. section 360bbb-3(b)(1), unless the authorization is terminated or revoked sooner. CORRECTED ON 01/03 AT [id number]: PREVIOUSLY REPORTED AS POSITIVE RESULT CALLED TO, READ BACK BY AND VERIFIED WITH: Heather Horn 11/29/2018 AT 2323 HS    Influenza A by PCR NEGATIVE NEGATIVE Final   Influenza B by PCR NEGATIVE NEGATIVE Final    Comment: (NOTE) The Xpert Xpress SARS-CoV-2/FLU/RSV assay is intended as an aid in  the diagnosis of influenza from Nasopharyngeal swab specimens and  should not be used as a sole basis for treatment. Nasal washings and  aspirates are unacceptable for Xpert Xpress SARS-CoV-2/FLU/RSV  testing. Fact Sheet for Patients: PinkCheek.be Fact Sheet for Healthcare Providers: GravelBags.it This test is not yet approved or cleared by the [place] and  has been authorized  for detection and/or diagnosis of SARS-CoV-2 by  FDA under an Emergency Use Authorization (EUA). This EUA will remain  in effect (meaning this test can be used) for the duration of the  Covid-19 declaration under Section 564(b)(1) of the Act, 21  U.S.C. section 360bbb-3(b)(1), unless the authorization is  terminated or revoked. Performed at [facility], [address] [place], [place] [address]      Labs: BNP (last 3 results) Recent Labs    11/29/19 1249  BNP 123456   Basic Metabolic Panel: Recent Labs  Lab 11/29/19 1249 11/30/19 1829 12/01/19 0504  NA 135 139 141  K 2.6* 2.8* 3.5  CL 104 108 112*  CO2 19* 19* 18*  GLUCOSE 106* 121* 118*  BUN <5* <5* <5*  CREATININE 0.50 0.47 0.42*  CALCIUM 8.1* 8.6* 8.1*  MG  --   --  1.5*  PHOS  --   --  2.7   Liver Function Tests: Recent Labs  Lab 11/30/19 2024 12/01/19 0504  AST 33 31  ALT 18 16  ALKPHOS 109 101  BILITOT 0.7 0.7  PROT 6.9 6.2*  ALBUMIN 3.2* 2.9*   No results for  input(s): LIPASE, AMYLASE in the last 168 hours. No results for input(s): AMMONIA in the last 168 hours. CBC: Recent Labs  Lab 11/29/19 1249 11/30/19 1829 12/01/19 0504  WBC 13.2* 22.2*  21.8* 23.0*  NEUTROABS 11.3* 20.4*  --   HGB 11.8* 11.5*  11.6* 10.7*  HCT 35.4* 34.9*  32.9* 32.7*  MCV 86.1 87.5  82.5 88.4  PLT 220 249  225 217   Cardiac Enzymes: No results for input(s): CKTOTAL, CKMB, CKMBINDEX, TROPONINI in the last 168 hours. BNP: Invalid input(s): POCBNP CBG: No results for input(s): GLUCAP in the last 168 hours. D-Dimer Recent Labs    11/30/19 2024 12/01/19 0504  DDIMER 1.52* 1.02*   Hgb A1c No results for input(s): HGBA1C in the last 72 hours. Lipid Profile Recent Labs    11/30/19 2024  TRIG 317*   Thyroid function studies No results for input(s): TSH, T4TOTAL, T3FREE, THYROIDAB in the last 72 hours.  Invalid input(s): FREET3 Anemia work up Recent Labs    11/30/19 2024 12/01/19 0504  FERRITIN 138 141   Urinalysis    Component Value Date/Time   COLORURINE YELLOW 11/30/2019 2044   APPEARANCEUR CLOUDY (A) 11/30/2019 2044   LABSPEC 1.020 11/30/2019 2044   PHURINE 6.5 11/30/2019 2044   GLUCOSEU NEGATIVE 11/30/2019 2044   HGBUR SMALL (A) 11/30/2019 2044   BILIRUBINUR SMALL (A) 11/30/2019 2044   KETONESUR >160 (A) 11/30/2019 2044   PROTEINUR 100 (A) 11/30/2019 2044   NITRITE NEGATIVE 11/30/2019 2044   LEUKOCYTESUR LARGE (A) 11/30/2019 2044   Sepsis Labs Invalid input(s): PROCALCITONIN,  WBC,  LACTICIDVEN Microbiology Recent Results (from the past 240 hour(s))  Blood Culture (routine x 2)     Status: None (Preliminary result)   Collection Time: 11/30/19  8:24 PM   Specimen: BLOOD  Result Value Ref Range Status   Specimen Description BLOOD LEFT ANTECUBITAL  Final   Special Requests   Final    BOTTLES DRAWN AEROBIC AND ANAEROBIC Blood Culture adequate volume   Culture   Final    NO GROWTH < 12 HOURS Performed at [facility],  [address], [place], [place] [address]    Report Status PENDING  Incomplete  Blood Culture (routine x 2)     Status: None (Preliminary result)   Collection Time: 11/30/19  8:44  PM   Specimen: BLOOD  Result Value Ref Range Status   Specimen Description BLOOD RIGHT HAND  Final   Special Requests   Final    BOTTLES DRAWN AEROBIC AND ANAEROBIC Blood Culture adequate volume   Culture   Final    NO GROWTH < 12 HOURS Performed at [facility], [address] [place], [place] [address]    Report Status PENDING  Incomplete  Respiratory Panel by RT PCR (Flu A&B, Covid) - Nasopharyngeal Swab     Status: Abnormal   Collection Time: 11/30/19 10:16 PM   Specimen: Nasopharyngeal Swab  Result Value Ref Range Status   SARS Coronavirus 2 by RT PCR POSITIVE (A) NEGATIVE Corrected    Comment: Heather Horn 11/30/2019 AT 2323 HS (NOTE) SARS-CoV-2 target nucleic acids are DETECTED. SARS-CoV-2 RNA is generally detectable in upper respiratory specimens  during the acute phase of infection. Positive results are indicative of the presence of the identified virus, but do not rule out bacterial infection or co-infection with other pathogens not detected by the test. Clinical correlation with patient history and other diagnostic information is necessary to determine patient infection status. The expected result is Negative. Fact Sheet for Patients:  PinkCheek.be Fact Sheet for Healthcare Providers: GravelBags.it This test is not yet approved or cleared by the [place] FDA and  has been authorized for detection and/or diagnosis of SARS-CoV-2 by FDA under an Emergency Use Authorization (EUA).  This EUA will remain in effect (meaning this test can be used) for the duration of  the COVID-19 declaration under S ection 564(b)(1) of the Act, 21 U.S.C. section 360bbb-3(b)(1), unless the authorization is terminated or revoked  sooner. CORRECTED ON 01/03 AT [id number]: PREVIOUSLY REPORTED AS POSITIVE RESULT CALLED TO, READ BACK BY AND VERIFIED WITH: Heather Horn 11/29/2018 AT 2323 HS    Influenza A by PCR NEGATIVE NEGATIVE Final   Influenza B by PCR NEGATIVE NEGATIVE Final    Comment: (NOTE) The Xpert Xpress SARS-CoV-2/FLU/RSV assay is intended as an aid in  the diagnosis of influenza from Nasopharyngeal swab specimens and  should not be used as a sole basis for treatment. Nasal washings and  aspirates are unacceptable for Xpert Xpress SARS-CoV-2/FLU/RSV  testing. Fact Sheet for Patients: PinkCheek.be Fact Sheet for Healthcare Providers: GravelBags.it This test is not yet approved or cleared by the [place] FDA and  has been authorized for detection and/or diagnosis of SARS-CoV-2 by  FDA under an Emergency Use Authorization (EUA). This EUA will remain  in effect (meaning this test can be used) for the duration of the  Covid-19 declaration under Section 564(b)(1) of the Act, 21  U.S.C. section 360bbb-3(b)(1), unless the authorization is  terminated or revoked. Performed at [facility], [address] [place], [place] [address]      Time coordinating discharge: Over 30 minutes  SIGNED:   Ezekiel Slocumb, DO [facility] 12/01/2019, 6:08 PM   If 7PM-7AM, please contact night-coverage [email or web address] Password [email or web address]

## 2019-12-02 LAB — URINE CULTURE: Culture: 30000 — AB

## 2019-12-02 MED ORDER — GENERIC EXTERNAL MEDICATION
Status: DC
Start: ? — End: 2019-12-02

## 2019-12-02 MED ORDER — IPRATROPIUM-ALBUTEROL 0.5-2.5 (3) MG/3ML IN SOLN
3.00 | RESPIRATORY_TRACT | Status: DC
Start: ? — End: 2019-12-02

## 2019-12-02 MED ORDER — PNV PRENATAL PLUS MULTIVITAMIN 27-1 MG PO TABS
1.00 | ORAL_TABLET | ORAL | Status: DC
Start: 2019-12-03 — End: 2019-12-02

## 2019-12-02 MED ORDER — HYDROMORPHONE HCL 1 MG/ML IJ SOLN
0.25 | INTRAMUSCULAR | Status: DC
Start: ? — End: 2019-12-02

## 2019-12-02 MED ORDER — ACETAMINOPHEN 325 MG PO TABS
650.00 | ORAL_TABLET | ORAL | Status: DC
Start: ? — End: 2019-12-02

## 2019-12-02 MED ORDER — GENERIC EXTERNAL MEDICATION
500.00 | Status: DC
Start: 2019-12-04 — End: 2019-12-02

## 2019-12-02 MED ORDER — BENZONATATE 100 MG PO CAPS
200.00 | ORAL_CAPSULE | ORAL | Status: DC
Start: 2019-12-02 — End: 2019-12-02

## 2019-12-02 MED ORDER — HEPARIN SODIUM (PORCINE) 5000 UNIT/ML IJ SOLN
10000.00 | INTRAMUSCULAR | Status: DC
Start: 2019-12-02 — End: 2019-12-02

## 2019-12-02 MED ORDER — LIDOCAINE HCL 1 % IJ SOLN
0.50 | INTRAMUSCULAR | Status: DC
Start: ? — End: 2019-12-02

## 2019-12-02 MED ORDER — GENERIC EXTERNAL MEDICATION
1.00 | Status: DC
Start: 2019-12-04 — End: 2019-12-02

## 2019-12-02 NOTE — ED Provider Notes (Addendum)
[facility] transport has arrived. I was asked to update EMTALA. Pt does continue to have significant tachypnea and O2 requirement, but is >90% on NRB. Feel that she can be transferred safely with >15L via NRB and [place] supplement PRN. Has been stable on this for >1 hr prior to transport. Mentating well. She certainly is at risk for eventual respiratory decompensation given her tachypnea and WOB, but she shows no signs of hypercapnea or tiring at this time, can speak in short sentences, and is mentating well. Given her high risk airway situation given her hypoxia, tachypnea, and pregnancy with need for close fetal monitoring during and after intubation, discussed with the [facility] team. Pt has been accepted to the MICU and will be transported emergently. Given that she's shown clinical stability for >1 hr on current regimen with no signs of tiring/impending acute resp failure at this time, feel she will be safe for transfer. Will advise left lateral decubitus position in transport.   Duffy Bruce, MD 12/02/19 Sheila Oats    Duffy Bruce, MD 12/02/19 [id number]

## 2019-12-02 NOTE — Progress Notes (Signed)
ANTICOAGULATION CONSULT NOTE - Initial Consult  Pharmacy Consult for heparin Indication: VTE prophylaxis  No Known Allergies  Patient Measurements:     Vital Signs: Temp: 99.2 F (37.3 C) (01/03 2340) Temp Source: Oral (01/03 2340) BP: 121/80 (01/04 0014) Pulse Rate: 115 (01/04 0014)  Labs: Recent Labs    11/29/19 1249 11/30/19 1829 12/01/19 0504  HGB 11.8* 11.5*  11.6* 10.7*  HCT 35.4* 34.9*  32.9* 32.7*  PLT 220 249  225 217  APTT  --   --  37*  LABPROT  --   --  11.7  INR  --   --  0.9  CREATININE 0.50 0.47 0.42*    Estimated Creatinine Clearance: 94.8 mL/min (A) (by C-G formula based on SCr of 0.42 mg/dL (L)).   Medical History: Past Medical History:  Diagnosis Date  . Gestational hypertension   . Phyllodes tumor of breast 2019    Medications:  Scheduled:  . vitamin C  500 mg Oral Daily  . dexamethasone (DECADRON) injection  6 mg Intravenous Q24H  . prenatal multivitamin  1 tablet Oral Q1200  . zinc sulfate  220 mg Oral Daily    Assessment: 34 y.o.female, [redacted] weeks pregnant, who presented to ED for worsening shortness of breath over last few days.  She had presented the day before, found COVID-19 positive, discharge with steroids, but her breathing got worse and she noted her oxygen sat on room air to by 87%.  In the ED, patient requiring supplemental oxygen.  CXR showed worsening bilateral airspace disease consistent with multifocal pneumonia.  Treated with Decadron and started on remdesivir, admitted to hospitalist service for further management of COVID-19 pneumonia.  Being started on heparin for suspected PE.  Goal of Therapy:  Heparin level 0.3-0.7 units/ml Monitor platelets by anticoagulation protocol: Yes   Plan:  Patient was receiving VTE prophylaxis subq heparin 5000 units q8h w/ last dose 01/03 @ 1700. Will start rate at 1050 units/hr. Baseline h/h low for patient, and aPTT slightly elevated. Patient has been transferred out to  [facility].  Tobie Lords, PharmD, BCPS Clinical Pharmacist 12/02/2019,1:55 AM

## 2019-12-03 ENCOUNTER — Telehealth: Payer: Self-pay

## 2019-12-03 MED ORDER — GENERIC EXTERNAL MEDICATION
Status: DC
Start: ? — End: 2019-12-03

## 2019-12-03 MED ORDER — DEXAMETHASONE SODIUM PHOSPHATE 4 MG/ML IJ SOLN
6.00 | INTRAMUSCULAR | Status: DC
Start: 2019-12-03 — End: 2019-12-03

## 2019-12-03 MED ORDER — FAMOTIDINE 20 MG/2ML IV SOLN
20.00 | INTRAVENOUS | Status: DC
Start: 2019-12-04 — End: 2019-12-03

## 2019-12-03 MED ORDER — NOREPINEPHRINE-SODIUM CHLORIDE 16-0.9 MG/250ML-% IV SOLN
0.01 | INTRAVENOUS | Status: DC
Start: ? — End: 2019-12-03

## 2019-12-03 MED ORDER — PNV PRENATAL PLUS MULTIVITAMIN 27-1 MG PO TABS
1.00 | ORAL_TABLET | ORAL | Status: DC
Start: 2019-12-03 — End: 2019-12-03

## 2019-12-03 MED ORDER — DEXTROSE 10 % IV SOLN
50.00 | INTRAVENOUS | Status: DC
Start: ? — End: 2019-12-03

## 2019-12-03 MED ORDER — CHLORHEXIDINE GLUCONATE 0.12 % MT SOLN
5.00 | OROMUCOSAL | Status: DC
Start: 2019-12-03 — End: 2019-12-03

## 2019-12-03 MED ORDER — PROPOFOL 100 MG/10ML IV EMUL
5.00 | INTRAVENOUS | Status: DC
Start: ? — End: 2019-12-03

## 2019-12-03 NOTE — Telephone Encounter (Signed)
Dr. Ysidro Evert from [facility] called 12/02/19 5:46 pm wanting to consult with the on-call regarding provided treatment.  [phone]  After hour nurse provided physician with phone number for her to attempt to call at a later time as attempted call was going straight to voicemail.

## 2019-12-04 NOTE — Telephone Encounter (Signed)
I contacted this physician.

## 2019-12-05 LAB — CULTURE, BLOOD (ROUTINE X 2)
Culture: NO GROWTH
Culture: NO GROWTH
Special Requests: ADEQUATE
Special Requests: ADEQUATE

## 2019-12-09 ENCOUNTER — Encounter: Payer: PRIVATE HEALTH INSURANCE | Admitting: Obstetrics & Gynecology

## 2020-06-25 ENCOUNTER — Ambulatory Visit
Admission: EM | Admit: 2020-06-25 | Discharge: 2020-06-25 | Disposition: A | Discharge status: Home / Self Care | Payer: PRIVATE HEALTH INSURANCE | Attending: Family Medicine | Admitting: Family Medicine

## 2020-06-25 DIAGNOSIS — R112 Nausea with vomiting, unspecified: Secondary | ICD-10-CM | POA: Insufficient documentation

## 2020-06-25 DIAGNOSIS — R42 Dizziness and giddiness: Secondary | ICD-10-CM | POA: Diagnosis not present

## 2020-06-25 DIAGNOSIS — Z20822 Contact with and (suspected) exposure to covid-19: Secondary | ICD-10-CM | POA: Insufficient documentation

## 2020-06-25 DIAGNOSIS — R0789 Other chest pain: Secondary | ICD-10-CM | POA: Insufficient documentation

## 2020-06-25 LAB — CBC WITH DIFFERENTIAL/PLATELET
Abs Immature Granulocytes: 0.04 10*3/uL (ref 0.00–0.07)
Basophils Absolute: 0.1 10*3/uL (ref 0.0–0.1)
Basophils Relative: 1 %
Eosinophils Absolute: 0 10*3/uL (ref 0.0–0.5)
Eosinophils Relative: 0 %
HCT: 44.4 % (ref 36.0–46.0)
Hemoglobin: 14.6 g/dL (ref 12.0–15.0)
Immature Granulocytes: 0 %
Lymphocytes Relative: 14 %
Lymphs Abs: 1.6 10*3/uL (ref 0.7–4.0)
MCH: 27.9 pg (ref 26.0–34.0)
MCHC: 32.9 g/dL (ref 30.0–36.0)
MCV: 84.7 fL (ref 80.0–100.0)
Monocytes Absolute: 0.6 10*3/uL (ref 0.1–1.0)
Monocytes Relative: 6 %
Neutro Abs: 8.5 10*3/uL — ABNORMAL HIGH (ref 1.7–7.7)
Neutrophils Relative %: 79 %
Platelets: 401 10*3/uL — ABNORMAL HIGH (ref 150–400)
RBC: 5.24 MIL/uL — ABNORMAL HIGH (ref 3.87–5.11)
RDW: 13.1 % (ref 11.5–15.5)
WBC: 10.8 10*3/uL — ABNORMAL HIGH (ref 4.0–10.5)
nRBC: 0 % (ref 0.0–0.2)

## 2020-06-25 LAB — TROPONIN I (HIGH SENSITIVITY): Troponin I (High Sensitivity): <2 ng/L (ref <18)

## 2020-06-25 LAB — PREGNANCY, URINE: Preg Test, Ur: NEGATIVE

## 2020-06-25 LAB — BASIC METABOLIC PANEL
Anion gap: 8 (ref 5–15)
BUN: 13 mg/dL (ref 6–20)
CO2: 22 mmol/L (ref 22–32)
Calcium: 9.1 mg/dL (ref 8.9–10.3)
Chloride: 108 mmol/L (ref 98–111)
Creatinine, Ser: 0.42 mg/dL — ABNORMAL LOW (ref 0.44–1.00)
GFR calc Af Amer: >60 mL/min (ref >60)
GFR calc non Af Amer: >60 mL/min (ref >60)
Glucose, Bld: 103 mg/dL — ABNORMAL HIGH (ref 70–99)
Potassium: 3.9 mmol/L (ref 3.5–5.1)
Sodium: 138 mmol/L (ref 135–145)

## 2020-06-25 MED ORDER — ONDANSETRON 8 MG PO TBDP: 8.0000 mg | ORAL_TABLET | Freq: Three times a day (TID) | ORAL | 0 refills | Status: AC | PRN

## 2020-06-25 MED ORDER — ONDANSETRON 8 MG PO TBDP
8.0000 mg | ORAL_TABLET | Freq: Once | ORAL | Status: AC
  Administered 2020-06-25: 8 mg via ORAL

## 2020-06-25 MED ORDER — MECLIZINE HCL 25 MG PO TABS: 25.0000 mg | ORAL_TABLET | Freq: Three times a day (TID) | ORAL | 0 refills | Status: AC | PRN

## 2020-06-25 MED ORDER — MECLIZINE HCL 25 MG PO TABS
25.0000 mg | ORAL_TABLET | Freq: Once | ORAL | Status: AC
  Administered 2020-06-25: 25 mg via ORAL

## 2020-06-25 NOTE — ED Provider Notes (Signed)
[facility] ____________________________________________  Time seen: Approximately 1:10 PM  I have reviewed the triage vital signs and the nursing notes.   HISTORY  Chief Complaint Chest Pain   HPI Heather Horn is a 34 y.o. female 6 months postpartum presenting for evaluation of dizziness with nausea vomiting diarrhea.  Patient reports she frequently is up in the middle the night as she has a 79-month-old, and reports this morning around 2 AM when she opened her eyes she felt the room was spinning.  States as soon as she got up she felt nauseous and began to vomit.  Patient reports multiple episodes of vomiting this morning with continued nausea.  Has had 2 or 3 episodes of diarrhea as well.  Denies abdominal pain.  States the room spinning sensation has continued and is present with movement.  States that she moves her head sideways the room spinning sensation begins. Denies ear pain. States as long as she sits still she does not have room spinning sensation or dizziness.  Denies cough, congestion, shortness of breath.  States she did have some chest tightness chest discomfort earlier after the onset of dizziness.  States she has had this similarly in the past with anxiety and felt that it was her anxiety.  Patient also reports she has had the room spinning sensation with movement multiple times in the past but has not sought care, and reports that usually resolves after a day.  Denies blood in stool or vomit.  Denies fevers or recent sickness.  No dysuria.  Denies current chest discomfort.  Denies confusion, paresthesias, unilateral weakness, vision changes.  Denies sick contacts.  States felt completely fine yesterday.  Denies other complaints.  Not a smoker.  Denies diabetes, hypertension or cardiac history.  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  Current Outpatient Medications:    acetaminophen (TYLENOL) 325 MG tablet, Take 650 mg by mouth every 6 (six) hours as needed., Disp: , Rfl:    acetaminophen (TYLENOL) 325 MG tablet, Take 2 tablets (650 mg total) by mouth every 6 (six) hours as needed for mild pain (or Fever >/= 101)., Disp:  , Rfl:    ascorbic acid (VITAMIN C) 500 MG tablet, Take 1 tablet (500 mg total) by mouth daily., Disp:  , Rfl:    benzonatate (TESSALON) 200 MG capsule, Take 1 capsule (200 mg total) by mouth 3 (three) times daily as needed for cough., Disp: 20 capsule, Rfl: 0   chlorpheniramine-HYDROcodone (TUSSIONEX) 10-8 MG/5ML SUER, Take 5 mLs by mouth every 12 (twelve) hours as needed for cough., Disp: 140 mL, Rfl:    dexamethasone (DECADRON) 10 MG/ML injection, Inject 0.6 mLs (6 mg total) into the vein daily., Disp: 1 mL, Rfl: 0   guaiFENesin-dextromethorphan (ROBITUSSIN DM) 100-10 MG/5ML syrup, Take 10 mLs by mouth every 4 (four) hours as needed for cough., Disp: 118 mL,  Rfl: 0   heparin 5000 UNIT/ML injection, Inject 1 mL (5,000 Units total) into the skin every 8 (eight) hours., Disp: 1 mL, Rfl:    HYDROcodone-acetaminophen (NORCO/VICODIN) 5-325 MG tablet, Take 1-2 tablets by mouth every 4 (four) hours as needed for moderate pain., Disp: 30 tablet, Rfl: 0   meclizine (ANTIVERT) 25 MG tablet, Take 1 tablet (25 mg total) by mouth 3 (three) times daily as needed for dizziness., Disp: 30 tablet, Rfl: 0    ondansetron (ZOFRAN ODT) 8 MG disintegrating tablet, Take 1 tablet (8 mg total) by mouth every 8 (eight) hours as needed for nausea or vomiting., Disp: 20 tablet, Rfl: 0   ondansetron (ZOFRAN) 4 MG tablet, Take 1 tablet (4 mg total) by mouth every 6 (six) hours as needed for nausea., Disp: 20 tablet, Rfl: 0   polyethylene glycol (MIRALAX / GLYCOLAX) 17 g packet, Take 17 g by mouth daily as needed for mild constipation., Disp: 14 each, Rfl: 0   potassium chloride 20 mEq in lactated ringers 1,000 mL, Inject 100 mL/hr into the vein continuous., Disp: , Rfl: 0   Prenatal Vit-Fe Fumarate-FA (PRENATAL MULTIVITAMIN) TABS tablet, Take 1 tablet by mouth daily at 12 noon., Disp:  , Rfl:    zinc sulfate 220 (50 Zn) MG capsule, Take 1 capsule (220 mg total) by mouth daily., Disp:  , Rfl:   Allergies Lexapro [escitalopram]  Family History  Problem Relation Age of Onset   Hypertension Mother    Hypertension Father     Social History Social History   Tobacco Use   Smoking status: Never Smoker   Smokeless tobacco: Never Used  Scientific laboratory technician Use: Never used  Substance Use Topics   Alcohol use: Not Currently   Drug use: Never    Review of Systems Constitutional: No fever/chills Eyes: No visual changes. ENT: No sore throat. Cardiovascular: Denies chest pain. Respiratory: Denies shortness of breath. Gastrointestinal: No abdominal pain.  As above.  Genitourinary: Negative for dysuria. Musculoskeletal: Negative for back pain. Skin: Negative for rash. Neurological: Negative for focal weakness or numbness.    ____________________________________________   PHYSICAL EXAM:  VITAL SIGNS: ED Triage Vitals  Enc Vitals Group     BP 06/25/20 1218 128/78     Pulse Rate 06/25/20 1218 98     Resp 06/25/20 1218 16     Temp 06/25/20 1218 97.7 F (36.5 C)     Temp Source 06/25/20 1218 Oral     SpO2 06/25/20 1218 99 %     Weight 06/25/20 1219 150 lb (68 kg)     Height 06/25/20  1219 5\' 1"  (1.549 m)     Head Circumference --      Peak Flow --      Pain Score 06/25/20 1218 6     Pain Loc --      Pain Edu? --      Excl. in [place]? --    Constitutional: Alert and oriented. Well appearing and in no acute distress. Eyes: Conjunctivae are normal. PERRL. EOMI.  Head: Atraumatic. No sinus tenderness to palpation. No swelling. No erythema.  Ears: no erythema, normal TMs bilaterally.  Attempted Dix-Hallpike testing, patient reports to nauseous with movement.  Post medication, positive left Dix-Hallpike,  Negative right.  Nose:No nasal congestion  Mouth/Throat: Mucous membranes are moist. No pharyngeal erythema. No tonsillar swelling or exudate.  Neck: No stridor.  No cervical spine tenderness to palpation. Hematological/Lymphatic/Immunilogical: No cervical lymphadenopathy. Cardiovascular: Normal rate, regular rhythm. Grossly  normal heart sounds.  Good peripheral circulation. Respiratory: Normal respiratory effort.  No retractions. No wheezes, rales or rhonchi. Good air movement.  Gastrointestinal: Soft and nontender. Normal Bowel sounds. Musculoskeletal: Ambulatory with steady gait.  Neurologic:  Normal speech and language. No gait instability.  No focal neurological deficits.  No ataxia.  No paresthesias.  Steady gait. Skin:  Skin appears warm, dry and intact. No rash noted. Psychiatric: Mood and affect are normal. Speech and behavior are normal.  _________________________________________   LABS (all labs ordered are listed, but only abnormal results are displayed)  Labs Reviewed  CBC WITH DIFFERENTIAL/PLATELET - Abnormal; Notable for the following components:      Result Value   WBC 10.8 (*)    RBC 5.24 (*)    Platelets 401 (*)    Neutro Abs 8.5 (*)    All other components within normal limits  BASIC METABOLIC PANEL - Abnormal; Notable for the following components:   Glucose, Bld 103 (*)    Creatinine, Ser 0.42 (*)    All other components within normal limits   PREGNANCY, URINE  TROPONIN I (HIGH SENSITIVITY)   ____________________________________________  EKG  ED ECG REPORT I, Marylene Land, the attending provider, personally viewed and interpreted this ECG.   Date: 06/25/2020  EKG Time: 1225  Rate: 81  Rhythm: normal sinus rhythm  Axis: normal  ST&T Change: none  RADIOLOGY  No results found. ____________________________________________   PROCEDURES Procedures    INITIAL IMPRESSION / ASSESSMENT AND PLAN / ED COURSE  Pertinent labs & imaging results that were available during my care of the patient were reviewed by me and considered in my medical decision making (see chart for details).  Overall well-appearing patient.  Room spinning sensation followed with the above complaints.  Denies any current or return of chest discomfort.  Discussed multiple differentials with patient including BPPV, viral illness, anxiety, cardiac and neurologic etiology.  Suspect benign positional vertigo.  8 milligrams ODT Zofran and 25 mg meclizine given orally in urgent care.   Post medication urgent care, patient reports feeling better.  Denies current dizziness, mild continued nausea.  Denies any pain at this time.  COVID-19 testing ordered.  Reproducible by exam.  Denies chest pain or shortness of breath.  Reports improved.  Encourage rest, fluids, supportive care will treat with Zofran meclizine at home.  Prompt reevaluation and proceed to ER for any worsening complaints.Discussed indication, risks and benefits of medications with patient.   Discussed follow up with Primary care physician this week, or ENT. Discussed follow up and return parameters including no resolution or any worsening concerns. Patient verbalized understanding and agreed to plan.   ____________________________________________   FINAL CLINICAL IMPRESSION(S) / ED DIAGNOSES  Final diagnoses:  Vertigo  Atypical chest pain  Non-intractable vomiting with nausea, unspecified  vomiting type     ED Discharge Orders         Ordered    ondansetron (ZOFRAN ODT) 8 MG disintegrating tablet  Every 8 hours PRN     Discontinue  Reprint     06/25/20 1403    meclizine (ANTIVERT) 25 MG tablet  3 times daily PRN     Discontinue  Reprint     06/25/20 1403           Note: This dictation was prepared with Dragon dictation along with smaller phrase technology. Any transcriptional errors that result from this process are unintentional.         Marylene Land, NP 06/25/20 1406

## 2020-06-25 NOTE — ED Triage Notes (Signed)
Pt reports having CP,  N/v/d that began this morning at 2am. sts feeling dizzy when laying down. No other symptoms or concerns at this time.

## 2020-06-25 NOTE — Discharge Instructions (Signed)
Take medication as prescribed. Rest. Drink plenty of fluids.   Follow-up closely with your primary care this week.  Follow-up with ear nose and throat as needed for continued vertigo or room spinning.  For any chest pain, worsening dizziness, no improvement or worsening concerns proceed directly to emergency room.

## 2020-06-26 LAB — SARS CORONAVIRUS 2 (TAT 6-24 HRS): SARS Coronavirus 2: NEGATIVE

## 2021-07-28 IMAGING — DX DG CHEST 1V PORT
1 series · 1 of 1 positions shown · non-contrast
Comparison: None.

CLINICAL DATA: Cough, fever, [id number] positive

EXAM:
PORTABLE CHEST 1 VIEW

[chest ap]
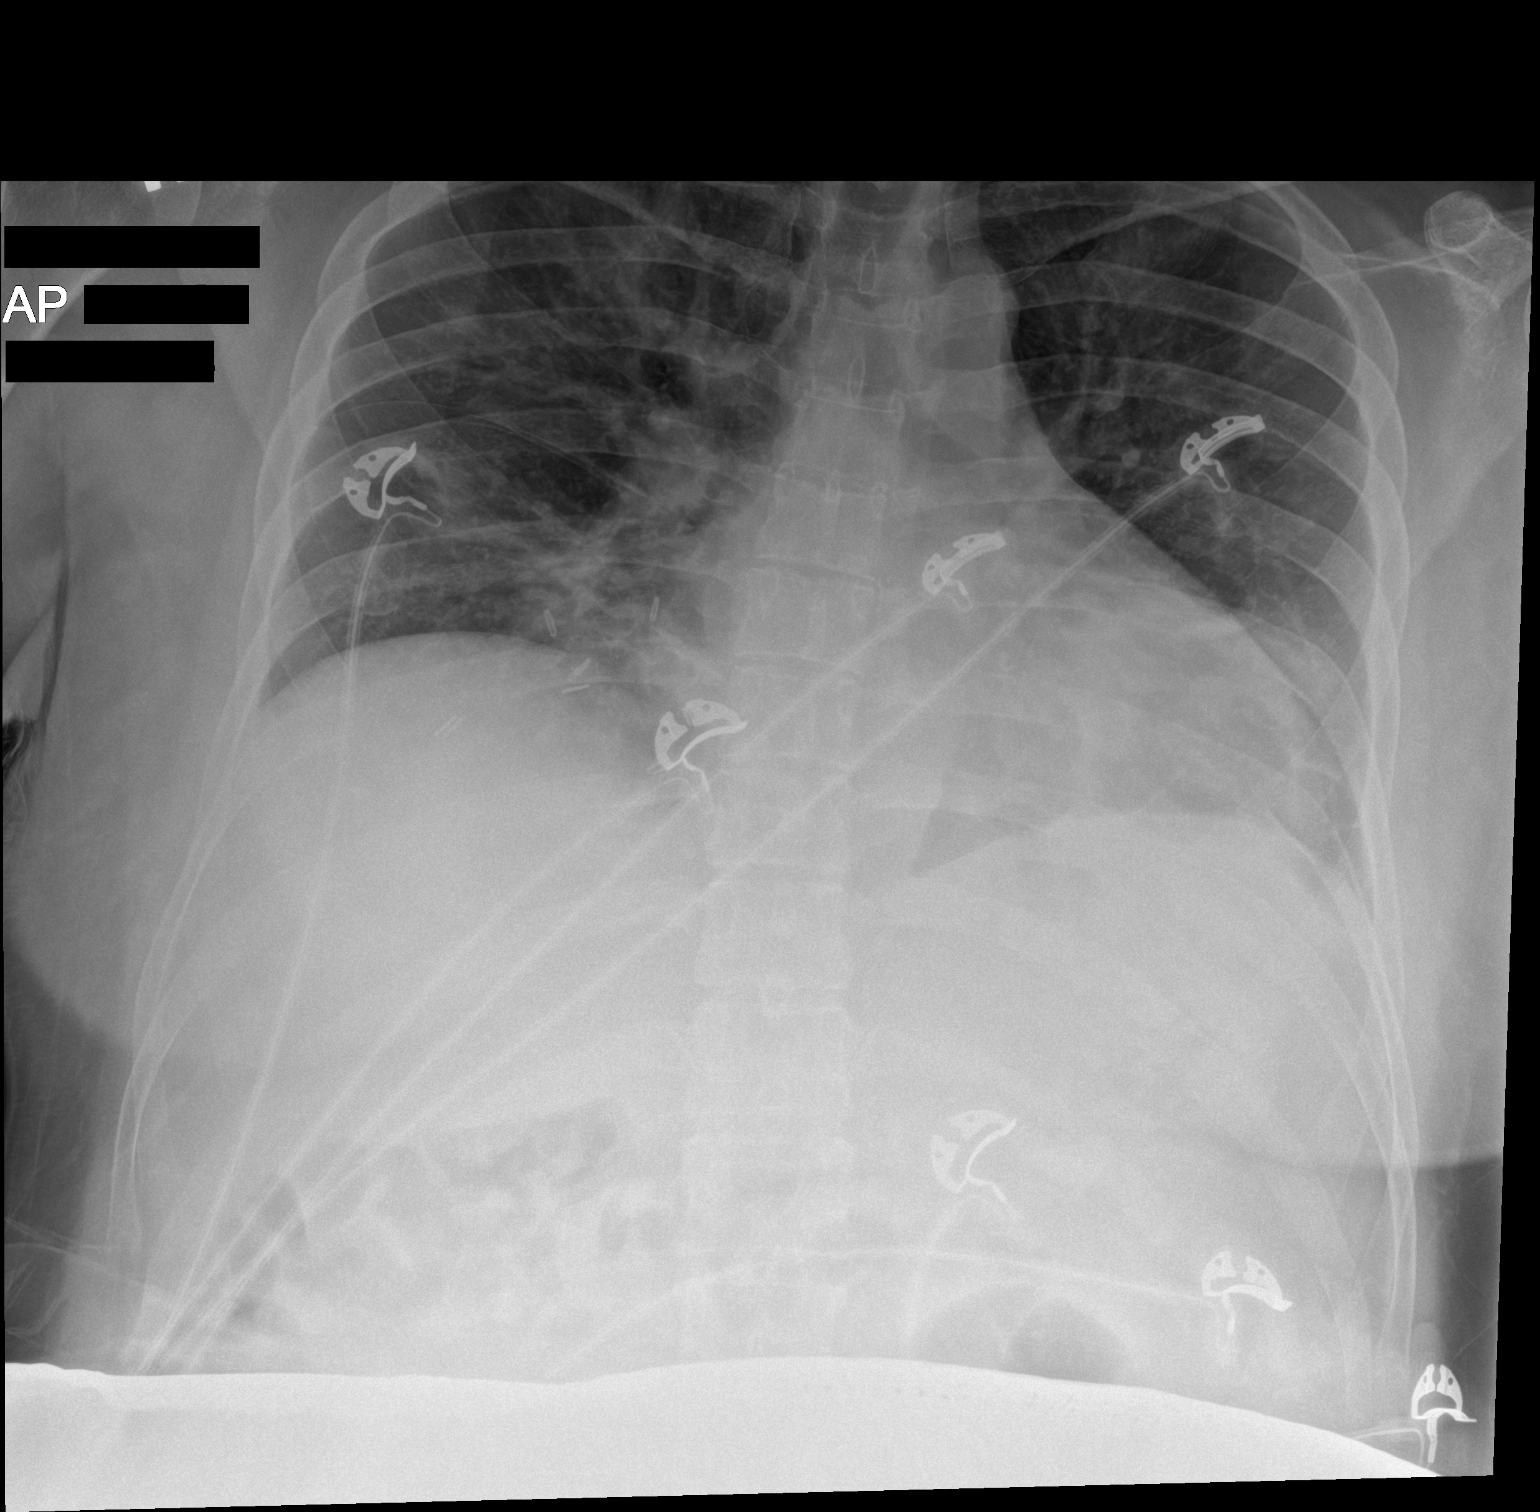

[1 of 1 positions shown; findings below may reference images not displayed]

FINDINGS: The heart size and mediastinal contours are within normal limits.
Streaky perihilar and bibasilar airspace opacities. No pleural
effusion or pneumothorax. The visualized skeletal structures are
unremarkable.
IMPRESSION: Streaky perihilar and bibasilar airspace opacities suspicious for
multifocal viral/atypical pneumonia in the setting of known viral
illness.
